# Patient Record
Sex: Female | Born: 1945 | Race: Black or African American | Hispanic: No | Marital: Married | Smoking: Never smoker
Health system: Southern US, Community
[De-identification: ages and names within clinical notes are randomized; demographics above are authoritative.]

## PROBLEM LIST (undated history)

## (undated) DIAGNOSIS — M21612 Bunion of left foot: Secondary | ICD-10-CM

## (undated) DIAGNOSIS — J449 Chronic obstructive pulmonary disease, unspecified: Secondary | ICD-10-CM

## (undated) DIAGNOSIS — E785 Hyperlipidemia, unspecified: Secondary | ICD-10-CM

## (undated) DIAGNOSIS — I872 Venous insufficiency (chronic) (peripheral): Secondary | ICD-10-CM

## (undated) DIAGNOSIS — M21611 Bunion of right foot: Secondary | ICD-10-CM

## (undated) DIAGNOSIS — I10 Essential (primary) hypertension: Secondary | ICD-10-CM

## (undated) DIAGNOSIS — M8580 Other specified disorders of bone density and structure, unspecified site: Secondary | ICD-10-CM

## (undated) DIAGNOSIS — H02403 Unspecified ptosis of bilateral eyelids: Secondary | ICD-10-CM

## (undated) DIAGNOSIS — J309 Allergic rhinitis, unspecified: Secondary | ICD-10-CM

## (undated) DIAGNOSIS — I6381 Other cerebral infarction due to occlusion or stenosis of small artery: Secondary | ICD-10-CM

## (undated) HISTORY — DX: Bunion of right foot: M21.611

## (undated) HISTORY — DX: Hyperlipidemia, unspecified: E78.5

## (undated) HISTORY — DX: Bunion of left foot: M21.612

## (undated) HISTORY — DX: Other cerebral infarction due to occlusion or stenosis of small artery: I63.81

## (undated) HISTORY — DX: Essential (primary) hypertension: I10

## (undated) HISTORY — DX: Allergic rhinitis, unspecified: J30.9

## (undated) HISTORY — DX: Venous insufficiency (chronic) (peripheral): I87.2

## (undated) HISTORY — PX: COLONOSCOPY: SHX174

## (undated) HISTORY — DX: Unspecified ptosis of bilateral eyelids: H02.403

## (undated) HISTORY — DX: Other specified disorders of bone density and structure, unspecified site: M85.80

## (undated) HISTORY — DX: Chronic obstructive pulmonary disease, unspecified: J44.9

---

## 1974-01-14 HISTORY — PX: BREAST BIOPSY: SHX20

## 1997-05-30 ENCOUNTER — Other Ambulatory Visit: Admission: RE | Admit: 1997-05-30 | Discharge: 1997-05-30 | Payer: Self-pay | Admitting: Obstetrics and Gynecology

## 1999-11-27 ENCOUNTER — Other Ambulatory Visit: Admission: RE | Admit: 1999-11-27 | Discharge: 1999-11-27 | Payer: Self-pay | Admitting: Obstetrics and Gynecology

## 2001-04-30 ENCOUNTER — Other Ambulatory Visit: Admission: RE | Admit: 2001-04-30 | Discharge: 2001-04-30 | Payer: Self-pay | Admitting: *Deleted

## 2001-08-11 ENCOUNTER — Encounter: Admission: RE | Admit: 2001-08-11 | Discharge: 2001-08-11 | Payer: Self-pay | Admitting: Family Medicine

## 2001-08-11 ENCOUNTER — Encounter: Payer: Self-pay | Admitting: Family Medicine

## 2002-01-21 ENCOUNTER — Ambulatory Visit (HOSPITAL_COMMUNITY): Admission: RE | Admit: 2002-01-21 | Discharge: 2002-01-21 | Payer: Self-pay | Admitting: Gastroenterology

## 2005-08-07 ENCOUNTER — Encounter: Admission: RE | Admit: 2005-08-07 | Discharge: 2005-08-07 | Payer: Self-pay | Admitting: Obstetrics and Gynecology

## 2006-01-14 DIAGNOSIS — I6381 Other cerebral infarction due to occlusion or stenosis of small artery: Secondary | ICD-10-CM

## 2006-01-14 HISTORY — DX: Other cerebral infarction due to occlusion or stenosis of small artery: I63.81

## 2006-12-19 ENCOUNTER — Encounter: Admission: RE | Admit: 2006-12-19 | Discharge: 2006-12-19 | Payer: Self-pay | Admitting: Family Medicine

## 2009-04-16 ENCOUNTER — Inpatient Hospital Stay (HOSPITAL_COMMUNITY): Admission: AD | Admit: 2009-04-16 | Discharge: 2009-04-16 | Payer: Self-pay | Admitting: Obstetrics & Gynecology

## 2009-08-18 ENCOUNTER — Encounter: Admission: RE | Admit: 2009-08-18 | Discharge: 2009-08-18 | Payer: Self-pay | Admitting: Family Medicine

## 2010-04-04 LAB — URINALYSIS, ROUTINE W REFLEX MICROSCOPIC
Bilirubin Urine: NEGATIVE
Glucose, UA: NEGATIVE mg/dL
Ketones, ur: NEGATIVE mg/dL
Nitrite: NEGATIVE
Protein, ur: 100 mg/dL — AB
Specific Gravity, Urine: 1.015 (ref 1.005–1.030)
Urobilinogen, UA: 0.2 mg/dL (ref 0.0–1.0)
pH: 7.5 (ref 5.0–8.0)

## 2010-04-04 LAB — URINE MICROSCOPIC-ADD ON

## 2010-06-01 NOTE — Op Note (Signed)
NAME:  Olivia Richmond, Olivia Richmond                          ACCOUNT NO.:  [id number]   MEDICAL RECORD NO.:  [id number]                   PATIENT TYPE:  AMB   LOCATION:  ENDO                                 FACILITY:  [facility]   PHYSICIAN:  Anselmo Rod, M.D.               DATE OF BIRTH:  Nov 09, 1945   DATE OF PROCEDURE:  01/21/2002  DATE OF DISCHARGE:                                 OPERATIVE REPORT   PROCEDURE PERFORMED:  Screening colonoscopy.   ENDOSCOPIST:  Anselmo Rod, M.D.   INSTRUMENT USED:  Pediatric adjustable Olympus colonoscope changed to an  adult colonoscope   INDICATIONS FOR PROCEDURE:  Screening colonoscopy being performed in a 65-  year-old African-American female, rule out colonic polyps, masses, etc.   PREPROCEDURE PREPARATION:  Informed consent was procured from the patient.  The patient was fasted for eight hours prior to the procedure and prepped  with a bottle of MiraLax and a bottle of Gatorade the night prior to the  procedure.   PREPROCEDURE PHYSICAL:  VITAL SIGNS:  The patient had stable vital signs.  NECK:  Supple.  CHEST:  Clear to auscultation.  S1 and S2 regular.  ABDOMEN:  Soft with normal bowel sounds.   DESCRIPTION OF PROCEDURE:  The patient was placed in the left lateral  decubitus position, sedated with 70 mg of  Demerol and 7 mg of Versed  intravenously.  Once the patient was adequately sedated and maintained on  low flow oxygen and continuous cardiac monitoring, the Olympus video  colonoscope was advanced into the rectum to the cecum with difficulty.  The  pediatric scope had to be withdrawn after several attempts and changing the  patient's position from the left lateral to supine and right lateral  position.  The scope could not be advanced beyond the hepatic flexure  because of redundancy.  Therefore, the pediatric scope was withdrawn and an  adult colonoscope was used instead.  The adult colonoscope was advanced up  to the cecum without  difficulty.  The appendicular orifice and the ileocecal  valve were clearly visualized and photographed.  No masses, polyps,  erosions, ulcerations, diverticula or hemorrhage were seen.   IMPRESSION:  1. Normal colonoscope up to the cecum.  2. Very tortuous colon.  No other abnormalities noted.   RECOMMENDATIONS:  1. A high fiber diet has been discussed with the patient.  2.     Repeat colorectal cancer screening is recommended in the next ten years     unless the patient develops any abnormal symptoms in the interim.  3. Outpatient follow up on a p.r.n. basis.                                               Anselmo Rod, M.D.  JNM/MEDQ  D:  01/21/2002  T:  01/21/2002  Job:  [id number]   cc:   Mosetta Putt, M.D.  [address] [place]  [place] [address]  Fax: [phone]

## 2011-07-11 ENCOUNTER — Encounter: Payer: Self-pay | Admitting: Family Medicine

## 2011-07-11 ENCOUNTER — Ambulatory Visit (INDEPENDENT_AMBULATORY_CARE_PROVIDER_SITE_OTHER): Payer: BC Managed Care – PPO | Admitting: Family Medicine

## 2011-07-11 VITALS — BP 110/74 | HR 64 | Ht 60.0 in | Wt 133.0 lb

## 2011-07-11 DIAGNOSIS — L089 Local infection of the skin and subcutaneous tissue, unspecified: Secondary | ICD-10-CM

## 2011-07-11 DIAGNOSIS — L723 Sebaceous cyst: Secondary | ICD-10-CM

## 2011-07-11 MED ORDER — CEPHALEXIN 500 MG PO CAPS: 500.0000 mg | ORAL_CAPSULE | Freq: Three times a day (TID) | ORAL | Status: AC

## 2011-07-11 NOTE — Patient Instructions (Addendum)
The cyst on your back is not infected, and likely doesn't require treatment.  If it changes in size, color, shape, then it should be removed.  If it gets infected, it will increase in size and become painful, and you should return for treatment.  Otherwise, the removal of this cyst is mostly cosmetic.  I incised and drained the infected sebaceous cyst behind your right ear. It will continue to drain some infected fluid.  Keep the area clean and dry until it heals.  Likely, the cyst may recur.  You may elect to have the cyst removed (by surgeon or dermatologist) to prevent further recurrent infection/abscess, but this is optional, and not urgent.  Please take the antibiotics three times daily for 10 days.  Return for re-check only if you have concerns (ongoing pain, drainage, fevers, or other concerns).  Epidermal Cyst An epidermal cyst is sometimes called a sebaceous cyst, epidermal inclusion cyst, or infundibular cyst. These cysts usually contain a substance that looks "pasty" or "cheesy" and may have a bad smell. This substance is a protein called keratin. Epidermal cysts are usually found on the face, neck, or trunk. They may also occur in the vaginal area or other parts of the genitalia of both men and women. Epidermal cysts are usually small, painless, slow-growing bumps or lumps that move freely under the skin. It is important not to try to pop them. This may cause an infection and lead to tenderness and swelling. CAUSES  Epidermal cysts may be caused by a deep penetrating injury to the skin or a plugged hair follicle, often associated with acne. SYMPTOMS  Epidermal cysts can become inflamed and cause:  Redness.   Tenderness.   Increased temperature of the skin over the bumps or lumps.   Grayish-white, bad smelling material that drains from the bump or lump.  DIAGNOSIS  Epidermal cysts are easily diagnosed by your caregiver during an exam. Rarely, a tissue sample (biopsy) may be taken to  rule out other conditions that may resemble epidermal cysts. TREATMENT   Epidermal cysts often get better and disappear on their own. They are rarely ever cancerous.   If a cyst becomes infected, it may become inflamed and tender. This may require opening and draining the cyst. Treatment with antibiotics may be necessary. When the infection is gone, the cyst may be removed with minor surgery.   Small, inflamed cysts can often be treated with antibiotics or by injecting steroid medicines.   Sometimes, epidermal cysts become large and bothersome. If this happens, surgical removal in your caregiver's office may be necessary.  HOME CARE INSTRUCTIONS  Only take over-the-counter or prescription medicines as directed by your caregiver.   Take your antibiotics as directed. Finish them even if you start to feel better.  SEEK MEDICAL CARE IF:   Your cyst becomes tender, red, or swollen.   Your condition is not improving or is getting worse.   You have any other questions or concerns.  MAKE SURE YOU:  Understand these instructions.   Will watch your condition.   Will get help right away if you are not doing well or get worse.  Document Released: 12/02/2003 Document Revised: 12/20/2010 Document Reviewed: 07/09/2010 [facility] Patient Information 2012 [place], [place].

## 2011-07-11 NOTE — Progress Notes (Signed)
Chief Complaint  Patient presents with  . Advice Only    new patient would like you to look at a mole on her back that has grown in size. Also behind her right ear on scalp she has a cyst that has been there x 1 month (very little pain associated).   HPI:  Saw Dr. Duaine Dredge about mass behind R ear.  He called it a "grease spot" and she would like second opinion.  Has noticed the lump in April 2013.  It has gotten a little larger recently, but denies any significant discomfort.    On her back, it used to be a blackhead, but now seems more like a mole.  Has changed in size.  Denies any pain or discomfort.  Would like to have it checked out--finds that it doesn't look very attractive, but not painful.  She is a patient of Dr. Duaine Dredge at [facility], but was seeking a second opinion regarding these lesions.  Past Medical History  Diagnosis Date  . Hypertension   . Hyperlipidemia   . Allergic rhinitis, cause unspecified     on immunotherapy monthly from allergist (pollen, dust, mold, shellfish)    Past Surgical History  Procedure Date  . Breast biopsy 1976    benign    History   Social History  . Marital Status: Married    Spouse Name: N/A    Number of Children: 2  . Years of Education: N/A   Occupational History  . reading teacher at [employer]    Social History Main Topics  . Smoking status: Never Smoker   . Smokeless tobacco: Never Used  . Alcohol Use: No  . Drug Use: No  . Sexually Active: Not on file   Other Topics Concern  . Not on file   Social History Narrative   Lives with husband, daughter, 1 dog.  Son lives in [place]    Family History  Problem Relation Age of Onset  . Kidney disease Mother   . Hypertension Mother   . Heart disease Father   . Hypertension Father   . Cancer Sister 33    breast cancer  . Hypertension Sister   . Hyperlipidemia Sister   . Hypertension Brother   . Heart disease Brother     atrial fibrillation  . Arthritis Brother     hip  replacement with ongoing pain; disabled  . Diabetes Neg Hx   . Hypertension Sister   . Hypertension Brother   . Heart disease Brother     MI  . Heart disease Brother     MI    Current outpatient prescriptions:aspirin 81 MG tablet, Take 81 mg by mouth daily., Disp: , Rfl: ;  atorvastatin (LIPITOR) 20 MG tablet, Take 20 mg by mouth daily., Disp: , Rfl: ;  chlorthalidone (HYGROTON) 25 MG tablet, Take 12.5 mg by mouth daily., Disp: , Rfl: ;  lisinopril (PRINIVIL,ZESTRIL) 20 MG tablet, Take 40 mg by mouth daily., Disp: , Rfl:  Multiple Vitamins-Minerals (MULTIVITAMIN WITH MINERALS) tablet, Take 2 tablets by mouth daily., Disp: , Rfl: ;  cephALEXin (KEFLEX) 500 MG capsule, Take 1 capsule (500 mg total) by mouth 3 (three) times daily., Disp: 30 capsule, Rfl: 0  Allergies  Allergen Reactions  . Shellfish Allergy Itching, Rash and Other (See Comments)    GI Upset.   ROS:  Denies headaches, dizziness, fevers, URI symptoms, cough, shortness of breath, skin rashes, GI complaints or other concerns.  PHYSICAL EXAM: BP 110/74  Pulse  64  Ht 5' (1.524 m)  Wt 133 lb (60.328 kg)  BMI 25.97 kg/m2 Well developed, pleasant female in no distress Skin: Upper back on R side-- Dark/raised area measures 8 x 10 mm, with central black portion measuring 5 x 7 mm.  Nontender.  No erythema, warmth, or fluctuance, nontender  Behind R ear--2 x 1 cm fluctuant area.  Nontender, with no overlying warmth, slight erythema.  Upon pressing on the lesion, there is a <55mm area of drainage just behind the ear, draining white material.  ASSESSMENT/PLAN: 1. Infected sebaceous cyst of skin  Incise and drain abcess, cephALEXin (KEFLEX) 500 MG capsule  2. Sebaceous cyst     Risks, benefits and alternatives of I&D was discussed with patient, who elected to proceed with procedure.  The area was cleansed with alcohol x 3 (due to pt being allergic to shellfish, avoided iodine), and then anesthetized with 1% lidocaine.  11 blade was  then used to make incision, and large amount of sebaceous, foul smelling and also some thin purulent material was obtained.  Patient tolerated the procedure well without complication.  Instructed on proper woundcare.  Will treat with Keflex, and advised to f/u if has any ongoing concerns.  Regarding the noninfected ECI on her back, she can return for elective excision, vs continue to monitor.  A picture was taken on her phone so that she can more easily monitor for change in shape/color.  If further changes occur, then needs to return for excision.  HTN--well controlled She plans to continue to be under the care of Dr. Duaine Dredge for her chronic medical problems.

## 2011-08-05 ENCOUNTER — Ambulatory Visit (INDEPENDENT_AMBULATORY_CARE_PROVIDER_SITE_OTHER): Payer: BC Managed Care – PPO | Admitting: Family Medicine

## 2011-08-05 ENCOUNTER — Encounter: Payer: Self-pay | Admitting: Family Medicine

## 2011-08-05 VITALS — BP 112/68 | HR 64 | Temp 97.6°F | Ht 60.0 in | Wt 134.0 lb

## 2011-08-05 DIAGNOSIS — L723 Sebaceous cyst: Secondary | ICD-10-CM

## 2011-08-05 DIAGNOSIS — I10 Essential (primary) hypertension: Secondary | ICD-10-CM

## 2011-08-05 NOTE — Progress Notes (Signed)
Chief Complaint  Patient presents with  . Follow-up    recheck cyst behind her ear.   She is s/p I&D of sebaceous cyst behind R ear on 6/27.  Completed course of keflex without problems, and is much better.  I believe this was supposed to be a one week f/u, but in error was scheduled for 1 month check. She denies any complaints today, except for some dizziness this morning.  Past Medical History  Diagnosis Date  . Hypertension   . Hyperlipidemia   . Allergic rhinitis, cause unspecified     on immunotherapy monthly from allergist (pollen, dust, mold, shellfish)    Current Outpatient Prescriptions on File Prior to Visit  Medication Sig Dispense Refill  . aspirin 81 MG tablet Take 81 mg by mouth daily.      Marland Kitchen atorvastatin (LIPITOR) 20 MG tablet Take 20 mg by mouth daily.      . chlorthalidone (HYGROTON) 25 MG tablet Take 12.5 mg by mouth daily.      Marland Kitchen lisinopril (PRINIVIL,ZESTRIL) 20 MG tablet Take 40 mg by mouth daily.      . Multiple Vitamins-Minerals (MULTIVITAMIN WITH MINERALS) tablet Take 2 tablets by mouth daily.       Allergies  Allergen Reactions  . Shellfish Allergy Itching, Rash and Other (See Comments)    GI Upset.   ROS: felt a little dizzy when she woke up this morning, and maybe a day last week.  Feels fine now. Denies any skin concerns or other problems  PHYSICAL EXAM:  BP 94/60  Pulse 64  Temp 97.6 F (36.4 C) (Oral)  Ht 5' (1.524 m)  Wt 134 lb (60.782 kg)  BMI 26.17 kg/m2 112/68 on repeat RA by MD Well developed, pleasant female in no distress Behind R ear--healed well, no drainage, erythema, or tenderness.  Very slight residual cyst, nontender. Back: EIC unchanged. There is also a very small one just below the larger one  ASSESSMENT/PLAN: 1. Sebaceous cyst   2. Essential hypertension, benign    Infected cyst--much improved/resolved.  Some residual cyst.  If it bothers her, send to derm or surgery for elective excision (near where her glasses sit).   Return here for I&D if recurrent infection.  This likely will not recur any time soon. Reinforced what we discussed at last visit regarding EIC on her back.  Mildly symptomatic low BP--encouraged to drink plenty of fluids.  If continues to have low BP and dizziness, will need meds adjusted--follow up with Dr. Duaine Dredge, as she is a patient at [facility].

## 2011-08-05 NOTE — Patient Instructions (Addendum)
Please make sure to drink plenty of fluids. If you are having ongoing dizziness, and low BP's, follow up with Dr. Duaine Dredge, as your blood pressure medication may need to be adjusted.  If you desire complete excision of the cyst behind right ear (if it starts to bother you with your glasses, etc), then I usually refer to dermatologist or general surgery for removal of lesions from the scalp.  This does not NEED to be removed, unless it bothers you.  There is still a small cyst, which potentially could get infected again--and increase in size and become tender, as before.

## 2011-08-09 ENCOUNTER — Ambulatory Visit: Payer: Self-pay | Admitting: Internal Medicine

## 2012-10-05 ENCOUNTER — Encounter: Payer: Self-pay | Admitting: Internal Medicine

## 2012-10-19 ENCOUNTER — Other Ambulatory Visit: Payer: Self-pay | Admitting: Family Medicine

## 2012-10-19 ENCOUNTER — Ambulatory Visit
Admission: RE | Admit: 2012-10-19 | Discharge: 2012-10-19 | Disposition: A | Discharge status: Home / Self Care | Payer: Medicare Other | Source: Ambulatory Visit | Attending: Family Medicine | Admitting: Family Medicine

## 2012-10-19 DIAGNOSIS — M79604 Pain in right leg: Secondary | ICD-10-CM

## 2012-11-21 ENCOUNTER — Ambulatory Visit (INDEPENDENT_AMBULATORY_CARE_PROVIDER_SITE_OTHER): Payer: BC Managed Care – PPO | Admitting: Family Medicine

## 2012-11-21 DIAGNOSIS — M25519 Pain in unspecified shoulder: Secondary | ICD-10-CM

## 2012-11-21 DIAGNOSIS — M25511 Pain in right shoulder: Secondary | ICD-10-CM
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
## 2012-11-24 ENCOUNTER — Ambulatory Visit (AMBULATORY_SURGERY_CENTER): Payer: BC Managed Care – PPO | Admitting: *Deleted

## 2012-11-24 VITALS — Ht 60.0 in | Wt 137.4 lb

## 2012-11-24 DIAGNOSIS — Z1211 Encounter for screening for malignant neoplasm of colon: Secondary | ICD-10-CM

## 2012-11-24 MED ORDER — NA SULFATE-K SULFATE-MG SULF 17.5-3.13-1.6 GM/177ML PO SOLN: 1.0000 | Freq: Once | ORAL | Status: DC

## 2012-11-24 NOTE — Progress Notes (Signed)
No allergies to eggs or soy. No problems with anesthesia.  

## 2012-12-04 ENCOUNTER — Encounter: Payer: Self-pay | Admitting: Internal Medicine

## 2012-12-04 ENCOUNTER — Ambulatory Visit (AMBULATORY_SURGERY_CENTER): Payer: Medicare Other | Admitting: Internal Medicine

## 2012-12-04 VITALS — BP 132/76 | HR 55 | Temp 97.2°F | Resp 19 | Ht 60.0 in | Wt 137.0 lb

## 2012-12-04 DIAGNOSIS — Z1211 Encounter for screening for malignant neoplasm of colon: Secondary | ICD-10-CM

## 2012-12-04 MED ORDER — SODIUM CHLORIDE 0.9 % IV SOLN: 500.0000 mL | INTRAVENOUS | Status: DC
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
## 2012-12-04 NOTE — Patient Instructions (Addendum)
The colonoscopy was normal and the prep was fine.  Next routine colonoscopy in 10 years - 2024.  It is the time of year to have a vaccination to prevent the flu (influenza virus). Please have this done through your primary care provider or you can get this done at local pharmacies or the [facility]. It would be very helpful if you notify your primary care provider when and where you had the vaccination given by messaging them in My Chart, leaving a message or faxing the information.  I appreciate the opportunity to care for you. Iva Boop, MD, [facility]   Discharge instructions given with verbal understanding. Normal exam. Resume previous medications. YOU HAD AN ENDOSCOPIC PROCEDURE TODAY AT THE [facility]: Refer to the procedure report that was given to you for any specific questions about what was found during the examination.  If the procedure report does not answer your questions, please call your gastroenterologist to clarify.  If you requested that your care partner not be given the details of your procedure findings, then the procedure report has been included in a sealed envelope for you to review at your convenience later.  YOU SHOULD EXPECT: Some feelings of bloating in the abdomen. Passage of more gas than usual.  Walking can help get rid of the air that was put into your GI tract during the procedure and reduce the bloating. If you had a lower endoscopy (such as a colonoscopy or flexible sigmoidoscopy) you may notice spotting of blood in your stool or on the toilet paper. If you underwent a bowel prep for your procedure, then you may not have a normal bowel movement for a few days.  DIET: Your first meal following the procedure should be a light meal and then it is ok to progress to your normal diet.  A half-sandwich or bowl of soup is an example of a good first meal.  Heavy or fried foods are harder to digest and may make you feel nauseous or bloated.  Likewise meals  heavy in dairy and vegetables can cause extra gas to form and this can also increase the bloating.  Drink plenty of fluids but you should avoid alcoholic beverages for 24 hours.  ACTIVITY: Your care partner should take you home directly after the procedure.  You should plan to take it easy, moving slowly for the rest of the day.  You can resume normal activity the day after the procedure however you should NOT DRIVE or use heavy machinery for 24 hours (because of the sedation medicines used during the test).    SYMPTOMS TO REPORT IMMEDIATELY: A gastroenterologist can be reached at any hour.  During normal business hours, 8:30 AM to 5:00 PM Monday through Friday, call [phone].  After hours and on weekends, please call the GI answering service at [phone] who will take a message and have the physician on call contact you.   Following lower endoscopy (colonoscopy or flexible sigmoidoscopy):  Excessive amounts of blood in the stool  Significant tenderness or worsening of abdominal pains  Swelling of the abdomen that is new, acute  Fever of 100F or higher  FOLLOW UP: If any biopsies were taken you will be contacted by phone or by letter within the next 1-3 weeks.  Call your gastroenterologist if you have not heard about the biopsies in 3 weeks.  Our staff will call the home number listed on your records the next business day following your procedure to check  on you and address any questions or concerns that you may have at that time regarding the information given to you following your procedure. This is a courtesy call and so if there is no answer at the home number and we have not heard from you through the emergency physician on call, we will assume that you have returned to your regular daily activities without incident.  SIGNATURES/CONFIDENTIALITY: You and/or your care partner have signed paperwork which will be entered into your electronic medical record.  These signatures attest to  the fact that that the information above on your After Visit Summary has been reviewed and is understood.  Full responsibility of the confidentiality of this discharge information lies with you and/or your care-partner.

## 2012-12-04 NOTE — Op Note (Signed)
[facility] [address] [place], [address]   COLONOSCOPY PROCEDURE REPORT  PATIENT: Olivia Richmond, Olivia Richmond.  MR#: [id number] BIRTHDATE: 1946/01/10 , 66  yrs. old GENDER: Female ENDOSCOPIST: Iva Boop, MD, [facility] REFERRED AO:ZHYQM Duaine Dredge, M.D. PROCEDURE DATE:  12/04/2012 PROCEDURE:   Colonoscopy, screening First Screening Colonoscopy - Avg.  risk and is 50 yrs.  old or older - No.  Prior Negative Screening - Now for repeat screening. 10 or more years since last screening  History of Adenoma - Now for follow-up colonoscopy & has been > or = to 3 yrs.  N/A  Polyps Removed Today? No.  Recommend repeat exam, <10 yrs? No. ASA CLASS:   Class II INDICATIONS:average risk screening and Last colonoscopy performed 10 years ago. MEDICATIONS: propofol (Diprivan) 200mg  IV, These medications were titrated to patient response per physician's verbal order, and MAC sedation, administered by CRNA  DESCRIPTION OF PROCEDURE:   After the risks benefits and alternatives of the procedure were thoroughly explained, informed consent was obtained.  A digital rectal exam revealed no abnormalities of the rectum.   The LB [id number]  endoscope was introduced through the anus and advanced to the cecum, which was identified by both the appendix and ileocecal valve. No adverse events experienced.   The quality of the prep was excellent using Suprep  The instrument was then slowly withdrawn as the colon was fully examined.      COLON FINDINGS: A normal appearing cecum, ileocecal valve, and appendiceal orifice were identified.  The ascending, hepatic flexure, transverse, splenic flexure, descending, sigmoid colon and rectum appeared unremarkable.  No polyps or cancers were seen. Retroflexed views revealed no abnormalities. The time to cecum=6 minutes 03 seconds.  Withdrawal time=8 minutes 0 seconds.  The scope was withdrawn and the procedure completed. COMPLICATIONS: There were no  complications.  ENDOSCOPIC IMPRESSION: Normal colonoscopy - excellent prep - second screening  RECOMMENDATIONS: Repeat colonoscopy/screening 10 years - 2024   eSigned:  Iva Boop, MD, [facility] 12/04/2012 1:53 PM   cc:

## 2012-12-07 ENCOUNTER — Telehealth: Payer: Self-pay | Admitting: *Deleted

## 2012-12-07 NOTE — Telephone Encounter (Signed)
  Follow up Call-  Call back number 12/04/2012  Post procedure Call Back phone  # [phone]  Permission to leave phone message Yes     Patient questions:  Do you have a fever, pain , or abdominal swelling? no Pain Score  0 *  Have you tolerated food without any problems? yes  Have you been able to return to your normal activities? yes  Do you have any questions about your discharge instructions: Diet   no Medications  no Follow up visit  no  Do you have questions or concerns about your Care? no  Actions: * If pain score is 4 or above: No action needed, pain <4.

## 2013-02-21 ENCOUNTER — Ambulatory Visit (INDEPENDENT_AMBULATORY_CARE_PROVIDER_SITE_OTHER): Payer: Medicare Other | Admitting: Emergency Medicine

## 2013-02-21 ENCOUNTER — Ambulatory Visit: Payer: Medicare Other

## 2013-02-21 VITALS — BP 104/62 | HR 68 | Temp 97.4°F | Resp 17 | Ht 60.75 in | Wt 134.4 lb

## 2013-02-21 DIAGNOSIS — R319 Hematuria, unspecified: Secondary | ICD-10-CM

## 2013-02-21 DIAGNOSIS — N390 Urinary tract infection, site not specified: Secondary | ICD-10-CM

## 2013-02-21 DIAGNOSIS — R300 Dysuria: Secondary | ICD-10-CM

## 2013-02-21 LAB — POCT UA - MICROSCOPIC ONLY
CASTS, UR, LPF, POC: NEGATIVE
Crystals, Ur, HPF, POC: NEGATIVE
Mucus, UA: NEGATIVE
Renal tubular cells: POSITIVE
Yeast, UA: NEGATIVE

## 2013-02-21 LAB — POCT URINALYSIS DIPSTICK
Bilirubin, UA: NEGATIVE
Glucose, UA: NEGATIVE
Ketones, UA: NEGATIVE
NITRITE UA: NEGATIVE
PH UA: 8.5
Protein, UA: 30
SPEC GRAV UA: 1.015
UROBILINOGEN UA: 0.2

## 2013-02-21 MED ORDER — CIPROFLOXACIN HCL 250 MG PO TABS: 250.0000 mg | ORAL_TABLET | Freq: Two times a day (BID) | ORAL | Status: DC

## 2013-02-21 NOTE — Patient Instructions (Signed)
Urinary Tract Infection  Urinary tract infections (UTIs) can develop anywhere along your urinary tract. Your urinary tract is your body's drainage system for removing wastes and extra water. Your urinary tract includes two kidneys, two ureters, a bladder, and a urethra. Your kidneys are a pair of bean-shaped organs. Each kidney is about the size of your fist. They are located below your ribs, one on each side of your spine.  CAUSES  Infections are caused by microbes, which are microscopic organisms, including fungi, viruses, and bacteria. These organisms are so small that they can only be seen through a microscope. Bacteria are the microbes that most commonly cause UTIs.  SYMPTOMS   Symptoms of UTIs may vary by age and gender of the patient and by the location of the infection. Symptoms in young women typically include a frequent and intense urge to urinate and a painful, burning feeling in the bladder or urethra during urination. Older women and men are more likely to be tired, shaky, and weak and have muscle aches and abdominal pain. A fever may mean the infection is in your kidneys. Other symptoms of a kidney infection include pain in your back or sides below the ribs, nausea, and vomiting.  DIAGNOSIS  To diagnose a UTI, your caregiver will ask you about your symptoms. Your caregiver also will ask to provide a urine sample. The urine sample will be tested for bacteria and white blood cells. White blood cells are made by your body to help fight infection.  TREATMENT   Typically, UTIs can be treated with medication. Because most UTIs are caused by a bacterial infection, they usually can be treated with the use of antibiotics. The choice of antibiotic and length of treatment depend on your symptoms and the type of bacteria causing your infection.  HOME CARE INSTRUCTIONS   If you were prescribed antibiotics, take them exactly as your caregiver instructs you. Finish the medication even if you feel better after you  have only taken some of the medication.   Drink enough water and fluids to keep your urine clear or pale yellow.   Avoid caffeine, tea, and carbonated beverages. They tend to irritate your bladder.   Empty your bladder often. Avoid holding urine for long periods of time.   Empty your bladder before and after sexual intercourse.   After a bowel movement, women should cleanse from front to back. Use each tissue only once.  SEEK MEDICAL CARE IF:    You have back pain.   You develop a fever.   Your symptoms do not begin to resolve within 3 days.  SEEK IMMEDIATE MEDICAL CARE IF:    You have severe back pain or lower abdominal pain.   You develop chills.   You have nausea or vomiting.   You have continued burning or discomfort with urination.  MAKE SURE YOU:    Understand these instructions.   Will watch your condition.   Will get help right away if you are not doing well or get worse.  Document Released: 10/10/2004 Document Revised: 07/02/2011 Document Reviewed: 02/08/2011  ExitCare Patient Information 2014 [employer].

## 2013-02-21 NOTE — Progress Notes (Signed)
Subjective:    Patient ID: Olivia Richmond, female    DOB: [birth year], 68 y.o.   MRN: [id number]  HPI This chart was scribed for Viviann Spare Denika Krone-MD, by Ladona Ridgel Day, Scribe. This patient was seen in room 5 and the patient's care was started at 10:45 AM.  HPI Comments: Olivia Richmond is a 68 y.o. female who presents to the [facility] complaining of hematuria and dysuria, onset yesterday. She reports that it feels like she has a UTI.   She reports that since yesterday, she has been having pressure while voiding. She got up twice last PM to void.   Also had a kidney stone about one month ago. Had abdominal X-ray at this time which showed a kidney stone.  She also reports about one month ago that she was having fever and chills. She saw a provider at this time who rx w/a cough syrup. She reports that these sx have since resolved.   Patient Active Problem List   Diagnosis Date Noted  . Essential hypertension, benign 08/05/2011    Past Surgical History  Procedure Laterality Date  . Breast biopsy Left 1976    benign  . Colonoscopy     Family History  Problem Relation Age of Onset  . Kidney disease Mother   . Hypertension Mother   . Heart disease Father   . Hypertension Father   . Cancer Sister 72    breast cancer  . Hypertension Sister   . Hyperlipidemia Sister   . Hypertension Brother   . Heart disease Brother     atrial fibrillation  . Arthritis Brother     hip replacement with ongoing pain; disabled  . Diabetes Neg Hx   . Colon cancer Neg Hx   . Hypertension Sister   . Hypertension Brother   . Heart disease Brother     MI  . Heart disease Brother     MI   History   Social History  . Marital Status: Married    Spouse Name: N/A    Number of Children: 2  . Years of Education: N/A   Occupational History  . reading teacher at [employer]    Social History Main Topics  . Smoking status: Never Smoker   . Smokeless tobacco: Never Used  .  Alcohol Use: No  . Drug Use: No  . Sexual Activity: Not on file   Other Topics Concern  . Not on file   Social History Narrative   Lives with husband, daughter, 1 dog.  Son lives in [place]   Allergies  Allergen Reactions  . Shellfish Allergy Itching, Rash and Other (See Comments)    GI Upset.   Results for orders placed in visit on 02/21/13  POCT URINALYSIS DIPSTICK      Result Value Range   Color, UA yellow     Clarity, UA cloudy     Glucose, UA neg     Bilirubin, UA neg     Ketones, UA neg     Spec Grav, UA 1.015     Blood, UA large     pH, UA 8.5     Protein, UA 30     Urobilinogen, UA 0.2     Nitrite, UA neg     Leukocytes, UA moderate (2+)    POCT UA - MICROSCOPIC ONLY      Result Value Range   WBC, Ur, HPF, POC tntc     RBC, urine, microscopic  tntc     Bacteria, U Microscopic 2+     Mucus, UA neg     Epithelial cells, urine per micros 0-2     Crystals, Ur, HPF, POC neg     Casts, Ur, LPF, POC neg     Yeast, UA neg     Renal tubular cells pos     Review of Systems  Constitutional: Negative for fever and chills.  Respiratory: Negative for cough and shortness of breath.   Cardiovascular: Negative for chest pain.  Gastrointestinal: Negative for abdominal pain.  Genitourinary: Positive for dysuria and hematuria.  Musculoskeletal: Negative for back pain.      Objective:   Physical Exam Nursing note and vitals reviewed. Constitutional: Patient is oriented to person, place, and time. Patient appears well-developed and well-nourished. No distress.  HENT:  Head: Normocephalic and atraumatic.  Neck: Neck supple. No tracheal deviation present.  Cardiovascular: Normal rate, regular rhythm and normal heart sounds.   No murmur heard. Pulmonary/Chest: Effort normal and breath sounds normal. No respiratory distress. Patient has no wheezes. Patient has no rales.  Musculoskeletal: Normal range of motion.  Neurological: Patient is alert and oriented to person, place, and  time.  Skin: Skin is warm and dry.  Psychiatric: Patient has a normal mood and affect. Patient's behavior is normal.  [facility] reading (PRIMARY) by  Dr. Cleta Albertsaub no acute changes are seen. There is what appears to be a slipped or calcium deposit deep in the right side of the pelvis. This measures about 4 mm x 1 mm. . Results for orders placed in visit on 02/21/13  POCT URINALYSIS DIPSTICK      Result Value Range   Color, UA yellow     Clarity, UA cloudy     Glucose, UA neg     Bilirubin, UA neg     Ketones, UA neg     Spec Grav, UA 1.015     Blood, UA large     pH, UA 8.5     Protein, UA 30     Urobilinogen, UA 0.2     Nitrite, UA neg     Leukocytes, UA moderate (2+)    POCT UA - MICROSCOPIC ONLY      Result Value Range   WBC, Ur, HPF, POC tntc     RBC, urine, microscopic tntc     Bacteria, U Microscopic 2+     Mucus, UA neg     Epithelial cells, urine per micros 0-2     Crystals, Ur, HPF, POC neg     Casts, Ur, LPF, POC neg     Yeast, UA neg     Renal tubular cells pos         Assessment & Plan:  Urine culture was done we'll force fluids we'll treat with Cipro twice a day for 5 days.   I personally performed the services described in this documentation, which was scribed in my presence. The recorded information has been reviewed and is accurate.

## 2013-02-23 LAB — URINE CULTURE: Colony Count: >=100000

## 2013-09-27 ENCOUNTER — Other Ambulatory Visit: Payer: Self-pay | Admitting: Family Medicine

## 2013-09-27 ENCOUNTER — Ambulatory Visit
Admission: RE | Admit: 2013-09-27 | Discharge: 2013-09-27 | Disposition: A | Discharge status: Home / Self Care | Payer: 59 | Source: Ambulatory Visit | Attending: Family Medicine | Admitting: Family Medicine

## 2013-09-27 DIAGNOSIS — M546 Pain in thoracic spine: Secondary | ICD-10-CM

## 2013-10-14 ENCOUNTER — Other Ambulatory Visit (HOSPITAL_COMMUNITY): Payer: 59

## 2013-10-29 ENCOUNTER — Other Ambulatory Visit (HOSPITAL_COMMUNITY): Payer: 59

## 2013-11-26 ENCOUNTER — Ambulatory Visit (HOSPITAL_COMMUNITY)
Admission: RE | Admit: 2013-11-26 | Discharge: 2013-11-26 | Disposition: A | Discharge status: Home / Self Care | Payer: Medicare Other | Source: Ambulatory Visit | Attending: Vascular Surgery | Admitting: Vascular Surgery

## 2013-11-26 ENCOUNTER — Other Ambulatory Visit (HOSPITAL_COMMUNITY): Payer: Self-pay | Admitting: Family Medicine

## 2013-11-26 DIAGNOSIS — R0989 Other specified symptoms and signs involving the circulatory and respiratory systems: Secondary | ICD-10-CM

## 2013-11-26 DIAGNOSIS — Z8673 Personal history of transient ischemic attack (TIA), and cerebral infarction without residual deficits: Secondary | ICD-10-CM | POA: Diagnosis not present

## 2015-04-24 ENCOUNTER — Ambulatory Visit (INDEPENDENT_AMBULATORY_CARE_PROVIDER_SITE_OTHER): Payer: Medicare Other | Admitting: Pediatrics

## 2015-04-24 ENCOUNTER — Encounter: Payer: Self-pay | Admitting: Pediatrics

## 2015-04-24 VITALS — BP 112/68 | HR 68 | Temp 97.6°F | Resp 16

## 2015-04-24 DIAGNOSIS — J301 Allergic rhinitis due to pollen: Secondary | ICD-10-CM | POA: Insufficient documentation

## 2015-04-24 DIAGNOSIS — I10 Essential (primary) hypertension: Secondary | ICD-10-CM | POA: Insufficient documentation

## 2015-04-24 DIAGNOSIS — H1010 Acute atopic conjunctivitis, unspecified eye: Secondary | ICD-10-CM

## 2015-04-24 DIAGNOSIS — H1045 Other chronic allergic conjunctivitis: Secondary | ICD-10-CM | POA: Diagnosis not present

## 2015-04-24 MED ORDER — FLUTICASONE PROPIONATE 50 MCG/ACT NA SUSP: NASAL | Status: DC

## 2015-04-24 NOTE — Addendum Note (Signed)
Addended by: Illene BolusFIELDS, Joory Gough M on: 04/24/2015 11:15 AM   Modules accepted: Orders

## 2015-04-24 NOTE — Addendum Note (Signed)
Addended by: Stephannie LiBARDELAS, JOSE A on: 04/24/2015 11:29 AM   Modules accepted: Orders

## 2015-04-24 NOTE — Progress Notes (Signed)
  [address] [place] [address] Dept: [phone]  FOLLOW UP NOTE  Patient ID: Olivia Richmond, female    DOB: 02/19/1945  Age: 70 y.o. MRN: [id number] Date of Office Visit: 04/24/2015  Assessment Chief Complaint: Burning Eyes  HPI Olivia Richmond presents for evaluation of nasfal congestion and  itchy watery eyes since the end of March.. She used to be on allergy injections to grass pollens and tree pollens. She stopped her allergy injections last year.  Current medications are fexofenadine 180 mg once a day and Zaditor 0.025% one drop 3 times a day Her other medications are outlined in the chart.   Drug Allergies:  Allergies  Allergen Reactions  . Shellfish Allergy Itching, Rash and Other (See Comments)    GI Upset.    Physical Exam: BP 112/68 mmHg  Pulse 68  Temp(Src) 97.6 F (36.4 C) (Oral)  Resp 16   Physical Exam  Constitutional: She is oriented to person, place, and time. She appears well-developed and well-nourished.  HENT:  Eyes-ptosis bilaterally and palpebral conjunctival erythema  Neck: Neck supple.  Cardiovascular:  s1 and S2 normal no murmurs  Pulmonary/Chest:  Clear to percussion and auscultatio  Lymphadenopathy:    She has no cervical adenopathy.  Neurological: She is alert and oriented to person, place, and time.  Skin:  clear  Psychiatric: She has a normal mood and affect. Her behavior is normal. Judgment and thought content normal.  Vitals reviewed.   Diagnostics:  none  Assessment and Plan: 1. Allergic rhinitis due to pollen   2. Seasonal allergic conjunctivitis   3. Essential hypertension         Patient Instructions  Allegra 180 mg-one tablet once a day for runny nose or itchy eyes Fluticasone 2 sprays per nostril once a day for stuffy nose Zaditor 0.025%-one drop in each eye 3 times a day to prevent allergies Opcon-A-one drop 3 times a day if needed for itchy eyes Add prednisone 10 mg twice a day for 4 days 10 mg on  the fifth day to bring your allergies under control Call me if you're not doing well on this treatment plan    Return if symptoms worsen or fail to improve.    Thank you for the opportunity to care for this patient.  Please do not hesitate to contact me with questions.  Tonette BihariJ. A. Micaiah Litle, M.D.  [facility] [address] [place], [place] [address] [phone]

## 2015-04-24 NOTE — Patient Instructions (Signed)
Allegra 180 mg-one tablet once a day for runny nose or itchy eyes Fluticasone 2 sprays per nostril once a day for stuffy nose Zaditor 0.025%-one drop in each eye 3 times a day to prevent allergies Opcon-A-one drop 3 times a day if needed for itchy eyes Add prednisone 10 mg twice a day for 4 days 10 mg on the fifth day to bring your allergies under control Call me if you're not doing well on this treatment plan

## 2015-05-31 ENCOUNTER — Other Ambulatory Visit: Payer: Self-pay | Admitting: *Deleted

## 2015-05-31 MED ORDER — FLUTICASONE PROPIONATE 50 MCG/ACT NA SUSP: NASAL | Status: AC

## 2016-02-28 IMAGING — CR DG THORACIC SPINE 3V
3 series · 3 of 3 positions shown · non-contrast
Comparison: 08/18/2009 chest x-ray.

CLINICAL DATA: Back pain.

EXAM:
THORACIC SPINE - 2 VIEW + SWIMMERS

[view not recorded (1 of 3)]
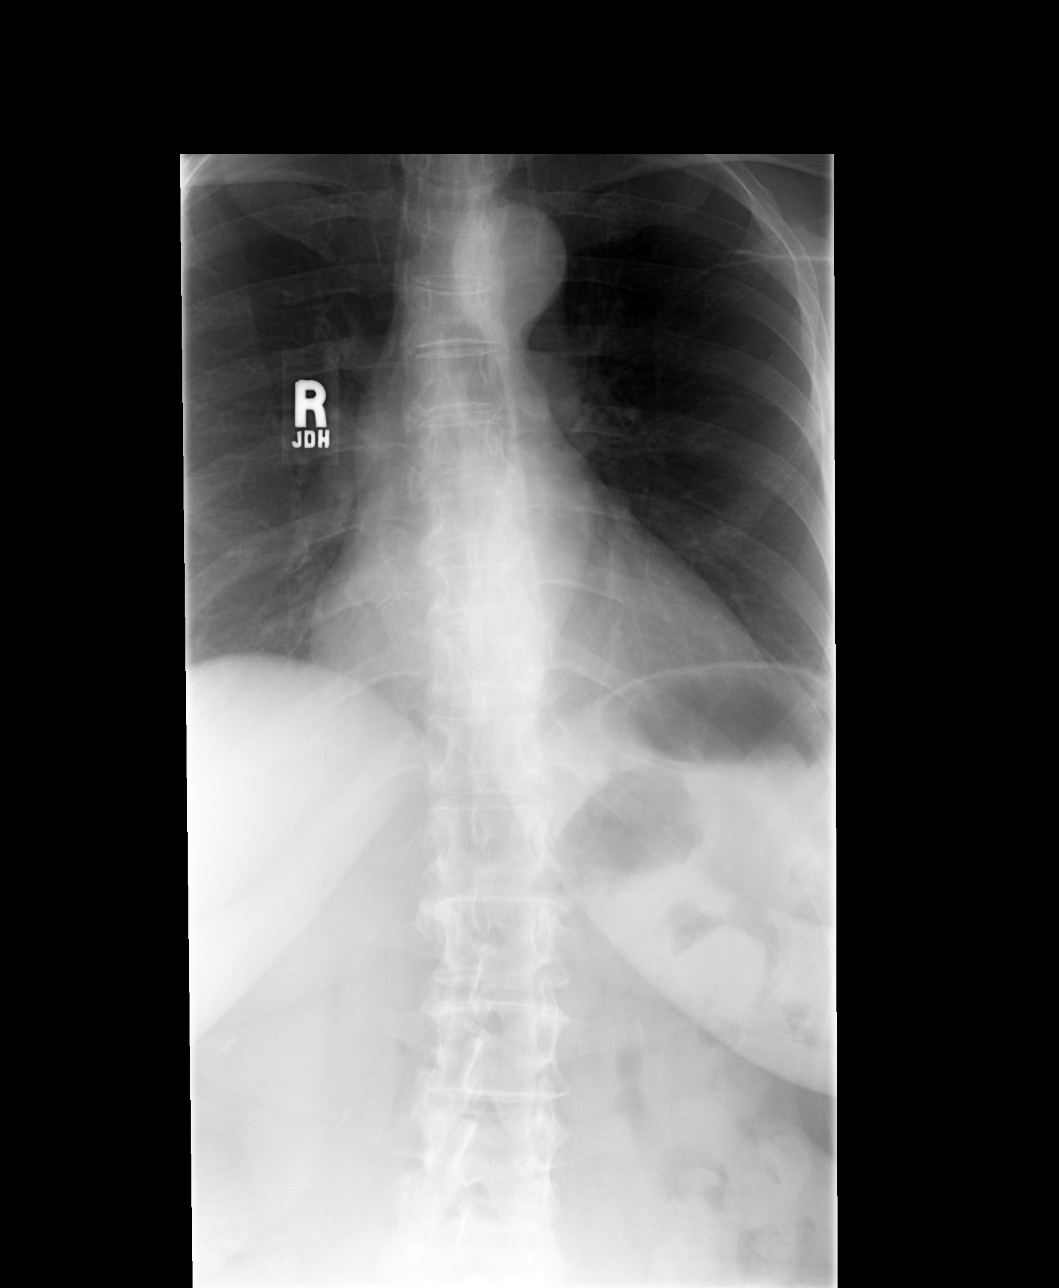

[view not recorded (2 of 3)]
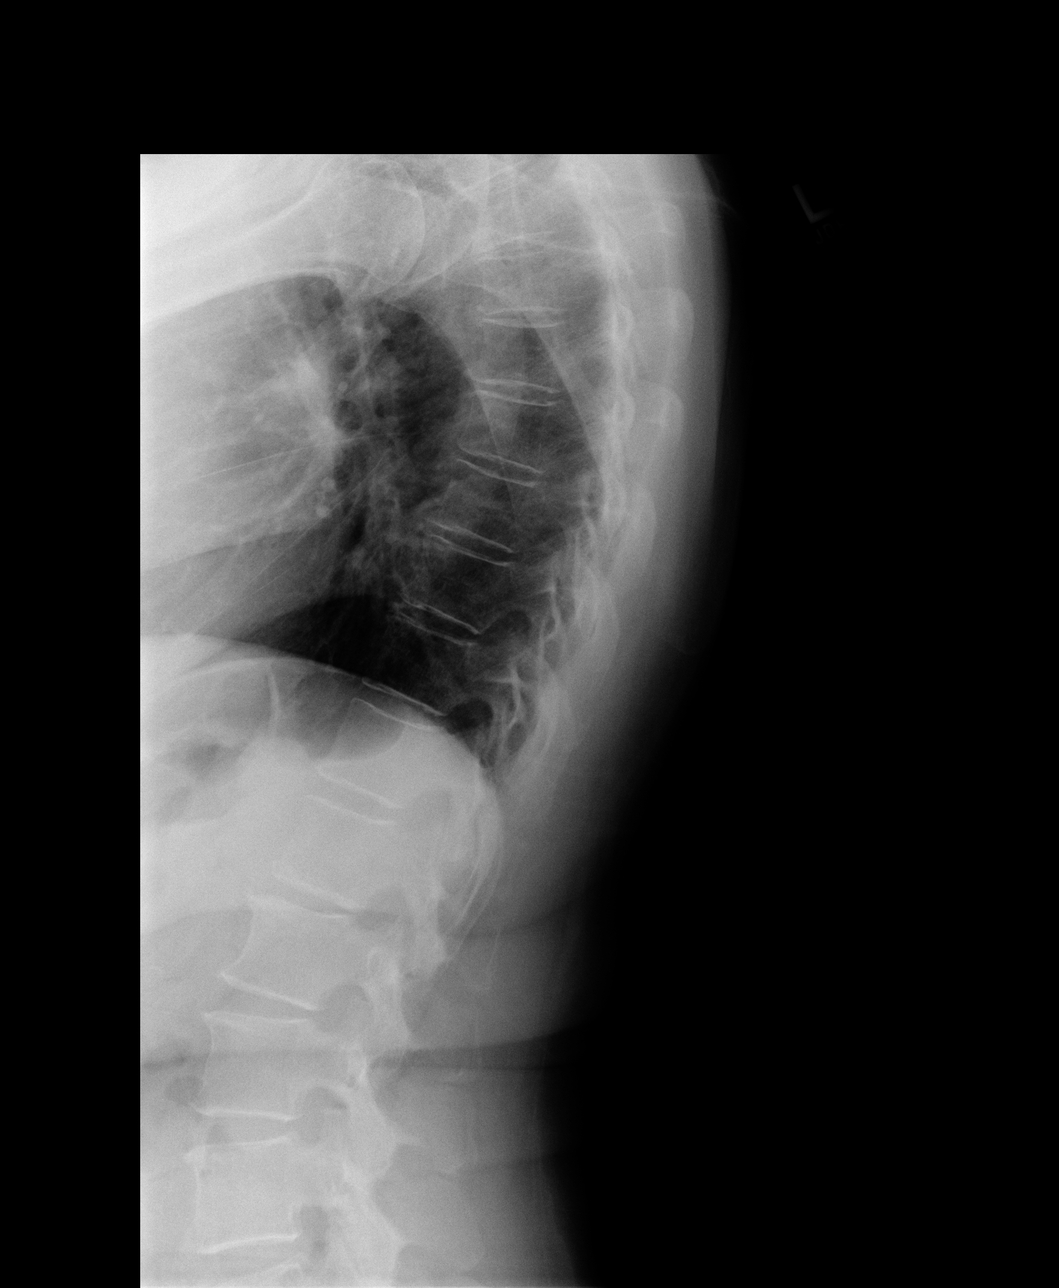

[view not recorded (3 of 3)]
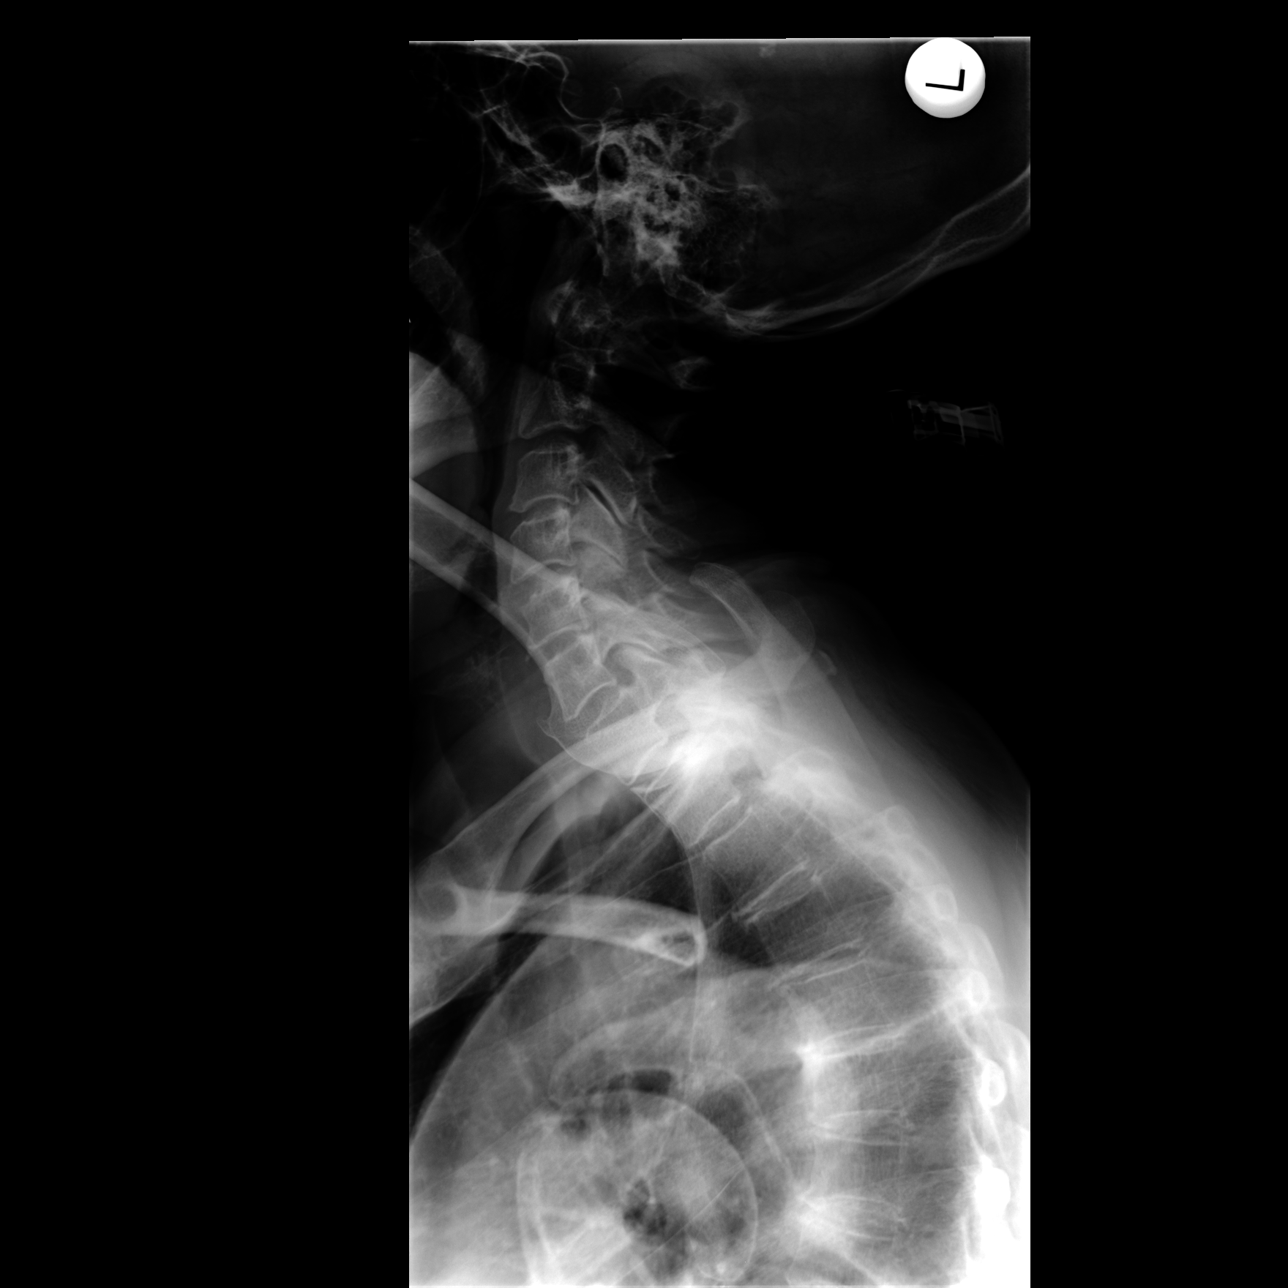

[3 of 3 positions shown; findings below may reference images not displayed]

FINDINGS: Normal alignment of the thoracic vertebral bodies. Disc spaces and
vertebral bodies are maintained. No acute compression fracture. No
abnormal paraspinal soft tissue thickening. The visualized posterior
ribs are intact. Mild stable thoracolumbar scoliosis.
IMPRESSION: Normal alignment and no acute bony findings or significant
degenerative changes.

## 2018-12-22 ENCOUNTER — Telehealth (HOSPITAL_COMMUNITY): Payer: Self-pay | Admitting: *Deleted

## 2018-12-22 NOTE — Telephone Encounter (Signed)
The above patient or their representative was contacted and gave the following answers to these questions:         Do you have any of the following symptoms?    NO  Fever                    Cough                   Shortness of breath  Do  you have any of the following other symptoms?    muscle pain         vomiting,        diarrhea        rash         weakness        red eye        abdominal pain         bruising          bruising or bleeding              joint pain           severe headache    Have you been in contact with someone who was or has been sick in the past 2 weeks?  NO  Yes                 Unsure                         Unable to assess   Does the person that you were in contact with have any of the following symptoms?   Cough         shortness of breath           muscle pain         vomiting,            diarrhea            rash            weakness           fever            red eye           abdominal pain           bruising  or  bleeding                joint pain                severe headache                 COMMENTS OR ACTION PLAN FOR THIS PATIENT:   12/22/2018 No to all per Olivia Richmond at Dr. Deboraha Sprang office.

## 2018-12-23 ENCOUNTER — Other Ambulatory Visit: Payer: Self-pay

## 2018-12-23 ENCOUNTER — Ambulatory Visit (HOSPITAL_COMMUNITY)
Admission: RE | Admit: 2018-12-23 | Discharge: 2018-12-23 | Disposition: A | Discharge status: Home / Self Care | Payer: Medicare Other | Source: Ambulatory Visit | Attending: Family Medicine | Admitting: Family Medicine

## 2018-12-23 ENCOUNTER — Encounter (HOSPITAL_COMMUNITY): Payer: Self-pay

## 2018-12-23 ENCOUNTER — Other Ambulatory Visit (HOSPITAL_COMMUNITY): Payer: Self-pay | Admitting: Family Medicine

## 2018-12-23 DIAGNOSIS — I639 Cerebral infarction, unspecified: Secondary | ICD-10-CM

## 2019-08-06 ENCOUNTER — Encounter (INDEPENDENT_AMBULATORY_CARE_PROVIDER_SITE_OTHER): Payer: Self-pay | Admitting: Otolaryngology

## 2019-08-06 ENCOUNTER — Ambulatory Visit (INDEPENDENT_AMBULATORY_CARE_PROVIDER_SITE_OTHER): Payer: Medicare PPO | Admitting: Otolaryngology

## 2019-08-06 ENCOUNTER — Other Ambulatory Visit: Payer: Self-pay

## 2019-08-06 VITALS — Temp 97.0°F

## 2019-08-06 DIAGNOSIS — H6121 Impacted cerumen, right ear: Secondary | ICD-10-CM | POA: Diagnosis not present

## 2019-08-06 DIAGNOSIS — H903 Sensorineural hearing loss, bilateral: Secondary | ICD-10-CM

## 2019-08-06 NOTE — Progress Notes (Signed)
HPI: Olivia Richmond is a 74 y.o. female who presents for evaluation of gradual decline in her hearing in both ears.  She has noticed that her hearing is gradually gotten worse to the point where she wants to consider hearing aids.  Denies any ear pain or drainage.  Past Medical History:  Diagnosis Date  . Allergic rhinitis, cause unspecified    on immunotherapy monthly from allergist (pollen, dust, mold, shellfish)  . Bilateral bunions   . COPD (chronic obstructive pulmonary disease) (HCC)    xray/spirometry   . Hyperlipidemia   . Hypertension   . Left sided lacunar stroke (HCC) 2008   thalamus, r facial wkness  . Osteopenia   . Ptosis, both eyelids   . Venous insufficiency    Past Surgical History:  Procedure Laterality Date  . BREAST BIOPSY Left 1976   benign  . COLONOSCOPY     Social History   Socioeconomic History  . Marital status: Married    Spouse name: Not on file  . Number of children: 2  . Years of education: Not on file  . Highest education level: Not on file  Occupational History  . 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 Smoking status: Never Smoker  . Smokeless tobacco: Never Used  Substance and Sexual Activity  . Alcohol use: No  . Drug use: No  . Sexual activity: Not on file  Other Topics Concern  . Not on file  Social History Narrative   Lives with husband, daughter, 1 dog.  Son lives in [place]   Social Determinants of Health   Financial Resource Strain:   . Difficulty of Paying Living Expenses:   Food Insecurity:   . Worried About Programme researcher, broadcasting/film/video in the Last Year:   . Barista in the Last Year:   Transportation Needs:   . Freight forwarder (Medical):   Marland Kitchen Lack of Transportation (Non-Medical):   Physical Activity:   . Days of Exercise per Week:   . Minutes of Exercise per Session:   Stress:   . Feeling of Stress :   Social Connections:   . Frequency of Communication with Friends and Family:   .  Frequency of Social Gatherings with Friends and Family:   . Attends Religious Services:   . Active Member of Clubs or Organizations:   . Attends Banker Meetings:   Marland Kitchen Marital Status:    Family History  Problem Relation Age of Onset  . Kidney disease Mother   . Hypertension Mother   . Heart disease Father   . Hypertension Father   . Cancer Sister 35       breast cancer  . Hypertension Sister   . Hyperlipidemia Sister   . Hypertension Brother   . Heart disease Brother        atrial fibrillation  . Arthritis Brother        hip replacement with ongoing pain; disabled  . Diabetes Neg Hx   . Colon cancer Neg Hx   . Hypertension Sister   . Hypertension Brother   . Heart disease Brother        MI  . Heart disease Brother        MI   Allergies  Allergen Reactions  . Shellfish Allergy Itching, Rash and Other (See Comments)    GI Upset.   Prior to Admission medications   Medication Sig Start Date End Date Taking? Authorizing Provider  aspirin 81 MG tablet Take 81 mg by mouth daily.   Yes [provider]  atorvastatin (LIPITOR) 20 MG tablet Take 20 mg by mouth daily.   Yes [provider]  chlorthalidone (HYGROTON) 25 MG tablet Take 12.5 mg by mouth daily.   Yes [provider]  cholecalciferol (VITAMIN D) 1000 UNITS tablet Take 1,000 Units by mouth daily.   Yes [provider]  ciprofloxacin (CIPRO) 250 MG tablet Take 1 tablet (250 mg total) by mouth 2 (two) times daily. 02/21/13  Yes Collene Gobble, MD  CRANBERRY PO Take 2 capsules by mouth daily. Reported on 04/24/2015   Yes [provider]  fexofenadine (ALLEGRA) 180 MG tablet Take 180 mg by mouth daily.   Yes [provider]  fluticasone (FLONASE) 50 MCG/ACT nasal spray Two Sprays into each nostril once daily for stuffy nose. 05/31/15  Yes Bardelas, Bonnita Hollow, MD  ketotifen (ZADITOR) 0.025 % ophthalmic solution 1 drop as needed.   Yes [provider]  losartan  (COZAAR) 50 MG tablet Take 50 mg by mouth daily.   Yes [provider]  Multiple Vitamins-Minerals (MULTIVITAMIN WITH MINERALS) tablet Take 2 tablets by mouth daily.   Yes [provider]  UNABLE TO FIND Med Name: allergy shots once a month   Yes [provider]     Positive ROS: Otherwise negative  All other systems have been reviewed and were otherwise negative with the exception of those mentioned in the HPI and as above.  Physical Exam: Constitutional: Alert, well-appearing, no acute distress Ears: External ears without lesions or tenderness.  She had moderate wax buildup in the right ear canal that was cleaned with suction and forceps.  TMs were otherwise clear.  On tuning fork testing she has moderate hearing loss in both ears with the 1024 tuning fork. Nasal: External nose without lesions.. Clear nasal passages Oral: Lips and gums without lesions. Tongue and palate mucosa without lesions. Posterior oropharynx clear. Neck: No palpable adenopathy or masses Respiratory: Breathing comfortably  Skin: No facial/neck lesions or rash noted.  Cerumen impaction removal  Date/Time: 08/06/2019 1:46 PM Performed by: Drema Halon, MD Authorized by: Drema Halon, MD   Consent:    Consent obtained:  Verbal   Consent given by:  Patient   Risks discussed:  Pain and bleeding Procedure details:    Location:  R ear   Procedure type: suction and forceps   Post-procedure details:    Inspection:  TM intact and canal normal   Hearing quality:  Improved   Patient tolerance of procedure:  Tolerated well, no immediate complications Comments:     TMs are clear bilaterally.  Audiogram demonstrated a mild hearing loss in both ears which was symmetric with SRT's of 35 dB bilaterally.  She had type a tympanograms bilaterally.  Assessment: Moderate bilateral sensorineural hearing loss. Wax buildup on the right side.  Plan: She would be a candidate for  hearing aids and discussed this with her.  Narda Bonds, MD

## 2019-08-10 ENCOUNTER — Encounter (INDEPENDENT_AMBULATORY_CARE_PROVIDER_SITE_OTHER): Payer: Self-pay

## 2019-08-23 ENCOUNTER — Ambulatory Visit (INDEPENDENT_AMBULATORY_CARE_PROVIDER_SITE_OTHER): Payer: Medicare PPO

## 2019-08-23 ENCOUNTER — Ambulatory Visit (INDEPENDENT_AMBULATORY_CARE_PROVIDER_SITE_OTHER): Payer: Medicare PPO | Admitting: Orthopaedic Surgery

## 2019-08-23 ENCOUNTER — Other Ambulatory Visit: Payer: Self-pay

## 2019-08-23 VITALS — Ht 61.0 in | Wt 128.0 lb

## 2019-08-23 DIAGNOSIS — G8929 Other chronic pain: Secondary | ICD-10-CM | POA: Diagnosis not present

## 2019-08-23 DIAGNOSIS — M25561 Pain in right knee: Secondary | ICD-10-CM

## 2019-08-23 MED ORDER — MELOXICAM 15 MG PO TABS: 15.0000 mg | ORAL_TABLET | Freq: Every day | ORAL | 3 refills | Status: AC

## 2019-08-23 MED ORDER — METHYLPREDNISOLONE 4 MG PO TABS: ORAL_TABLET | ORAL | 0 refills | Status: DC

## 2019-08-23 NOTE — Progress Notes (Signed)
Office Visit Note   Patient: Olivia Richmond           Date of Birth: 06/09/1945           MRN: [id number] Visit Date: 08/23/2019              Requested by: Mosetta Putt, MD [address] [place],  [place] [address] PCP: Mosetta Putt, MD   Assessment & Plan: Visit Diagnoses:  1. Chronic pain of right knee     Plan: I did show her her x-rays and explained in detail that she does have some arthritis in the knee but is only mild.  I did recommend a steroid injection and quad strengthening exercises.  She would like to try medical treatment for so I recommended combination of Voltaren gel combined with a steroid taper and meloxicam.  I explained quad strengthening exercises as well.  All question concerns were answered and addressed.  We can see her back in a month to consider alternative treatment such as a steroid injection if this does not help.  Follow-Up Instructions: Return in about 4 weeks (around 09/20/2019).   Orders:  Orders Placed This Encounter  Procedures  . XR Knee 1-2 Views Right   Meds ordered this encounter  Medications  . methylPREDNISolone (MEDROL) 4 MG tablet    Sig: Medrol dose pack. Take as instructed    Dispense:  21 tablet    Refill:  0  . meloxicam (MOBIC) 15 MG tablet    Sig: Take 1 tablet (15 mg total) by mouth daily.    Dispense:  30 tablet    Refill:  3      Procedures: No procedures performed   Clinical Data: No additional findings.   Subjective: Chief Complaint  Patient presents with  . Right Knee - Pain  The patient is a very pleasant 74 year old female comes in for evaluation treatment of right knee pain.  She says her right knee has been hurting for years but has been getting worse recently.  She says if she has been sitting for a while and gets up to walk it is painful and she can walk it off.  He gets stiff.  She says is worse at the end of the day as well.  She never had surgery on that knee.  She is a thin individual and  not a diabetic.  She is never injured the knee as well.  She says her left knee does not bother her.  She said no other acute change in medical status.  She is not a diabetic and does not smoke.  She is a thin individual as well.  She denies any locking or catching with the right knee.  HPI  Review of Systems She currently denies any headache, chest pain, shortness of breath, fever, chills, nausea, vomiting  Objective: Vital Signs: Ht 5\' 1"  (1.549 m)   Wt 128 lb (58.1 kg)   BMI 24.19 kg/m   Physical Exam She is alert and orient x3 and in no acute distress Ortho Exam examination of her right knee shows no effusion.  There is varus malalignment that is mild.  Her range of motion is full.  There are some global tenderness but no instability exam.  There is some slight patellofemoral crepitation. Specialty Comments:  No specialty comments available.  Imaging: XR Knee 1-2 Views Right  Result Date: 08/23/2019 2 views of the right knee showed no acute findings.  There is some mild patellofemoral arthritic  changes.  The medial lateral compartments are well-maintained.    PMFS History: Patient Active Problem List   Diagnosis Date Noted  . Allergic rhinitis due to pollen 04/24/2015  . Seasonal allergic conjunctivitis 04/24/2015  . Essential hypertension 04/24/2015  . Essential hypertension, benign 08/05/2011   Past Medical History:  Diagnosis Date  . Allergic rhinitis, cause unspecified    on immunotherapy monthly from allergist (pollen, dust, mold, shellfish)  . Bilateral bunions   . COPD (chronic obstructive pulmonary disease) (HCC)    xray/spirometry   . Hyperlipidemia   . Hypertension   . Left sided lacunar stroke (HCC) 2008   thalamus, r facial wkness  . Osteopenia   . Ptosis, both eyelids   . Venous insufficiency     Family History  Problem Relation Age of Onset  . Kidney disease Mother   . Hypertension Mother   . Heart disease Father   . Hypertension Father   .  Cancer Sister 70       breast cancer  . Hypertension Sister   . Hyperlipidemia Sister   . Hypertension Brother   . Heart disease Brother        atrial fibrillation  . Arthritis Brother        hip replacement with ongoing pain; disabled  . Diabetes Neg Hx   . Colon cancer Neg Hx   . Hypertension Sister   . Hypertension Brother   . Heart disease Brother        MI  . Heart disease Brother        MI    Past Surgical History:  Procedure Laterality Date  . BREAST BIOPSY Left 1976   benign  . COLONOSCOPY     Social History   Occupational History  . 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 Smoking status: Never Smoker  . Smokeless tobacco: Never Used  Substance and Sexual Activity  . Alcohol use: No  . Drug use: No  . Sexual activity: Not on file

## 2019-09-21 ENCOUNTER — Encounter: Payer: Self-pay | Admitting: Orthopaedic Surgery

## 2019-09-21 ENCOUNTER — Ambulatory Visit: Payer: Medicare PPO | Admitting: Orthopaedic Surgery

## 2019-09-21 DIAGNOSIS — G8929 Other chronic pain: Secondary | ICD-10-CM

## 2019-09-21 DIAGNOSIS — M25561 Pain in right knee: Secondary | ICD-10-CM | POA: Diagnosis not present

## 2019-09-21 NOTE — Progress Notes (Signed)
The patient is continue to experience right knee pain.  She is 74 years old and I did see her 4 weeks ago.  At that point x-ray showed just some mild arthritic changes in the knee.  I have recommended topical Voltaren gel as well as oral meloxicam.  We did talk about a steroid injection but she still wants to try other conservative treatment measures.  Her pain is worse with standing on the right knee for long period time.  She said her pain really depends on the amount of activities that she does.  She denies any knee swelling.  She also denies any knee locking catching.  Examination of her right knee today shows no effusion.  There is slight varus malalignment with good range of motion overall.  The knee is ligamentously stable.  She is interested in at least trying outpatient physical therapy for her right knee to see if that will help improve the strength of the knee and decrease her pain.  I still offered a steroid injection but she wants to continue to try conservative treatment which is certainly reasonable.  We will work on setting up outpatient physical therapy and then see her back in follow-up.  All questions and concerns were answered and addressed.

## 2019-09-22 ENCOUNTER — Other Ambulatory Visit: Payer: Self-pay

## 2019-09-22 DIAGNOSIS — G8929 Other chronic pain: Secondary | ICD-10-CM

## 2019-10-06 ENCOUNTER — Ambulatory Visit: Payer: Medicare PPO | Admitting: Rehabilitative and Restorative Service Providers"

## 2019-10-15 ENCOUNTER — Ambulatory Visit (INDEPENDENT_AMBULATORY_CARE_PROVIDER_SITE_OTHER): Payer: Medicare PPO | Admitting: Physical Therapy

## 2019-10-15 ENCOUNTER — Other Ambulatory Visit: Payer: Self-pay

## 2019-10-15 DIAGNOSIS — R262 Difficulty in walking, not elsewhere classified: Secondary | ICD-10-CM | POA: Diagnosis not present

## 2019-10-15 DIAGNOSIS — M6281 Muscle weakness (generalized): Secondary | ICD-10-CM

## 2019-10-15 DIAGNOSIS — G8929 Other chronic pain: Secondary | ICD-10-CM | POA: Diagnosis not present

## 2019-10-15 DIAGNOSIS — M25561 Pain in right knee: Secondary | ICD-10-CM | POA: Diagnosis not present

## 2019-10-15 NOTE — Patient Instructions (Signed)
Access Code: [email or web address] URL: [email or web address] Date: 10/15/2019 Prepared by: Narda Amber  Exercises Supine Active Straight Leg Raise - 3 x daily - 7 x weekly - 2 sets - 10 reps Clamshell - 3 x daily - 7 x weekly - 2 sets - 10 reps Standing Hip Extension with Counter Support - 3 x daily - 7 x weekly - 2 sets - 10 reps Standing March with Counter Support - 3 x daily - 7 x weekly - 2 sets - 10 reps Standing Hip Abduction with Counter Support - 3 x daily - 7 x weekly - 2 sets - 10 reps

## 2019-10-15 NOTE — Therapy (Signed)
[facility] Physical Therapy [address] [place], [place], [address] Phone: [phone]   Fax:  [phone]  Physical Therapy Evaluation  Patient Details  Name: Olivia Richmond MRN: [id number] Date of Birth: 04/30/1945 Referring Provider (PT): Doneen Poisson, MD   Encounter Date: 10/15/2019   PT End of Session - 10/15/19 1126    Visit Number 1    Number of Visits 12    Date for PT Re-Evaluation 11/26/19    Authorization Type humana    Authorization - Visit Number 1    Authorization - Number of Visits 12    PT Start Time 1015    PT Stop Time 1100    PT Time Calculation (min) 45 min    Activity Tolerance Patient tolerated treatment well    Behavior During Therapy [facility] for tasks assessed/performed           Past Medical History:  Diagnosis Date  . Allergic rhinitis, cause unspecified    on immunotherapy monthly from allergist (pollen, dust, mold, shellfish)  . Bilateral bunions   . COPD (chronic obstructive pulmonary disease) (HCC)    xray/spirometry   . Hyperlipidemia   . Hypertension   . Left sided lacunar stroke (HCC) 2008   thalamus, r facial wkness  . Osteopenia   . Ptosis, both eyelids   . Venous insufficiency     Past Surgical History:  Procedure Laterality Date  . BREAST BIOPSY Left 1976   benign  . COLONOSCOPY      There were no vitals filed for this visit.    Subjective Assessment - 10/15/19 1021    Subjective Pt arriving reporting R knee pain that began about 6 months ago. Pt reproting before pandemic she was going to [facility] about 3 times each week and she was not having any pains. Since pandemic pt has not been able to return to the [facility] and her knee pain has gradualy worsened over the last 6 months. Pt reporting pain at rest is 1/10. Pt reprots after an active day of grocery shopping her pain can increase to 6/10. Pt reporting medial knee pain.    Pertinent History HTN, COPD, 2008 lacunar stroke    How long can you sit  comfortably? 10-15 minutes    How long can you walk comfortably? 15 minutes    Patient Stated Goals not to have pain, be able to build endurance to keep going, return to exercising at [facility]    Currently in Pain? Yes    Pain Score 1     Pain Location Knee    Pain Orientation Right    Pain Descriptors / Indicators Aching    Pain Type Chronic pain    Pain Radiating Towards radiates upward to anterior thigh    Pain Onset More than a month ago    Pain Frequency Intermittent    Aggravating Factors  walking    Pain Relieving Factors sleeping, resting, no pain first thing in the morning              [facility] PT Assessment - 10/15/19 0001      Assessment   Medical Diagnosis Chronic R knee pain M25.561    Referring Provider (PT) Doneen Poisson, MD    Hand Dominance Right      Precautions   Precautions None      Restrictions   Weight Bearing Restrictions No      Home Environment   Living Environment Private residence      Cognition  Overall Cognitive Status Within Functional Limits for tasks assessed      Observation/Other Assessments   Focus on Therapeutic Outcomes (FOTO)  %  Limitation      Posture/Postural Control   Posture/Postural Control Postural limitations    Postural Limitations Rounded Shoulders;Forward head      ROM / Strength   AROM / PROM / Strength AROM;PROM;Strength      AROM   Overall AROM  --    AROM Assessment Site Knee    Right/Left Knee Right;Left    Right Knee Extension -2   hypertension   Right Knee Flexion 130    Left Knee Extension -2   hyperextension   Left Knee Flexion 133      PROM   PROM Assessment Site Knee    Right/Left Knee Right;Left    Right Knee Flexion 135      Strength   Strength Assessment Site Hip;Knee    Right/Left Hip Right;Left    Right Hip Flexion 3+/5    Right Hip Extension 3/5    Right Hip ABduction 3-/5    Right Hip ADduction 3-/5    Left Hip Flexion 3+/5    Left Hip Extension 3+/5    Right/Left Knee  Right;Left    Right Knee Flexion 4+/5    Right Knee Extension 4+/5    Left Knee Flexion 5/5    Left Knee Extension 5/5      Palpation   Palpation comment limitation in lateral patella movement on bilatera knees      Transfers   Five time sit to stand comments  12.14 seconds No UE support      Balance   Balance Assessed Yes      Standardized Balance Assessment   Standardized Balance Assessment Timed Up and Go Test      Timed Up and Go Test   Normal TUG (seconds) 10.5                      Objective measurements completed on examination: See above findings.                    PT Long Term Goals - 10/15/19 1110      PT LONG TERM GOAL #1   Title Pt will be independent in her HEP and progression    Time 6    Period Weeks    Status New    Target Date 11/26/19      PT LONG TERM GOAL #2   Title Pt will be able to tolerate walking 30 minutes with pain </= 2/10.    Time 6    Period Weeks    Status New    Target Date 11/26/19      PT LONG TERM GOAL #3   Title Pt will be able to improve her bilateral hip strength to grossly 4/5 in order to improve functional mobility.    Time 6    Period Weeks    Status New    Target Date 11/26/19                  Plan - 10/15/19 1103    Clinical Impression Statement Pt arriving to therapy for evaluation of R knee pain that has been ongoing over the last 6 or more months. Pt reporting difficulty walking more than 15 minutes before having to sit down. Pt reporting pain along anterior joint line. Pt reporting prior to her pain onset she had been  working out at [employer] with no problems. Pt stated since the pandemic she hasn't been able to attend. Pt presenting with weakness in bilateral hip and R knee. Skilled PT needed to progress pt toward her PLOF and returning to [facility].    Personal Factors and Comorbidities Comorbidity 2    Comorbidities COPD, HTN, h/o lacunar stroke    Examination-Activity  Limitations Stand;Sit;Stairs;Lift    Stability/Clinical Decision Making Stable/Uncomplicated    Clinical Decision Making Low    Rehab Potential Good    PT Frequency 2x / week    PT Duration 6 weeks    PT Treatment/Interventions ADLs/Self Care Home Management;Cryotherapy;Electrical Stimulation;Moist Heat;Iontophoresis 4mg /ml Dexamethasone;Ultrasound;Gait training;Stair training;Functional mobility training;Therapeutic activities;Therapeutic exercise;Balance training;Neuromuscular re-education;Patient/family education;Manual techniques;Passive range of motion;Taping    PT Next Visit Plan Assess hip ROM, Nustep/Bike, LE strengtheing, hip stretching    PT Home Exercise Plan Access Code: [email or web address]    Consulted and Agree with Plan of Care Patient           Patient will benefit from skilled therapeutic intervention in order to improve the following deficits and impairments:  Pain, Decreased mobility, Decreased endurance, Decreased strength  Visit Diagnosis: Chronic pain of right knee - Plan: PT plan of care cert/re-cert  Muscle weakness (generalized) - Plan: PT plan of care cert/re-cert  Difficulty in walking, not elsewhere classified - Plan: PT plan of care cert/re-cert     Problem List Patient Active Problem List   Diagnosis Date Noted  . Allergic rhinitis due to pollen 04/24/2015  . Seasonal allergic conjunctivitis 04/24/2015  . Essential hypertension 04/24/2015  . Essential hypertension, benign 08/05/2011    08/07/2011, PT, MPT 10/15/2019, 11:27 AM  [facility] Physical Therapy [address] [place], [place], [place] Phone: [phone]   Fax:  [phone]  Name: Olivia Richmond MRN: [name] Date of Birth: [birth year]

## 2019-10-20 ENCOUNTER — Ambulatory Visit: Payer: Medicare PPO | Admitting: Rehabilitative and Restorative Service Providers"

## 2019-10-20 ENCOUNTER — Encounter: Payer: Self-pay | Admitting: Rehabilitative and Restorative Service Providers"

## 2019-10-20 ENCOUNTER — Other Ambulatory Visit: Payer: Self-pay

## 2019-10-20 DIAGNOSIS — R262 Difficulty in walking, not elsewhere classified: Secondary | ICD-10-CM

## 2019-10-20 DIAGNOSIS — M6281 Muscle weakness (generalized): Secondary | ICD-10-CM

## 2019-10-20 DIAGNOSIS — G8929 Other chronic pain: Secondary | ICD-10-CM

## 2019-10-20 DIAGNOSIS — M25561 Pain in right knee: Secondary | ICD-10-CM

## 2019-10-20 NOTE — Therapy (Signed)
[facility] Physical Therapy [address] [place], [place], [address] Phone: [phone]   Fax:  [phone]  Physical Therapy Treatment  Patient Details  Name: ITZIA CUNLIFFE MRN: [id number] Date of Birth: [birth year] Referring Provider (PT): Doneen Poisson, MD   Encounter Date: 10/20/2019   PT End of Session - 10/20/19 1027    Visit Number 2    Number of Visits 12    Date for PT Re-Evaluation 11/26/19    Authorization Type humana    Authorization - Visit Number 2    Authorization - Number of Visits 12    PT Start Time 1021    PT Stop Time 1100    PT Time Calculation (min) 39 min    Activity Tolerance Patient tolerated treatment well    Behavior During Therapy [facility] for tasks assessed/performed           Past Medical History:  Diagnosis Date  . Allergic rhinitis, cause unspecified    on immunotherapy monthly from allergist (pollen, dust, mold, shellfish)  . Bilateral bunions   . COPD (chronic obstructive pulmonary disease) (HCC)    xray/spirometry   . Hyperlipidemia   . Hypertension   . Left sided lacunar stroke (HCC) 2008   thalamus, r facial wkness  . Osteopenia   . Ptosis, both eyelids   . Venous insufficiency     Past Surgical History:  Procedure Laterality Date  . BREAST BIOPSY Left 1976   benign  . COLONOSCOPY      There were no vitals filed for this visit.   Subjective Assessment - 10/20/19 1026    Subjective Pt. indicated she was feeling some mild improvement at times.  No pain to report upon arrival today.  Pt. stated lying on side hurt and made that one execise from HEP hard to do.    Pertinent History HTN, COPD, 2008 lacunar stroke    How long can you sit comfortably? 10-15 minutes    How long can you walk comfortably? 15 minutes    Patient Stated Goals not to have pain, be able to build endurance to keep going, return to exercising at [facility]    Currently in Pain? No/denies    Pain Score 0-No pain    Pain Onset More than  a month ago                             [facility] Adult PT Treatment/Exercise - 10/20/19 0001      Neuro Re-ed    Neuro Re-ed Details  tandem ambulation fwd 10 ft x 8 in // bars,       Exercises   Exercises Knee/Hip      Knee/Hip Exercises: Aerobic   Recumbent Bike lvl 2 6 mins      Knee/Hip Exercises: Standing   Other Standing Knee Exercises lateral stepping 10 ft x 4 each way, green band      Knee/Hip Exercises: Seated   Other Seated Knee/Hip Exercises slr 2 x 10 bilateral                  PT Education - 10/20/19 1027    Education Details Review of HEP, adjusted techniques    Person(s) Educated Patient    Methods Explanation;Demonstration;Verbal cues    Comprehension Verbalized understanding;Returned demonstration               PT Long Term Goals - 10/15/19 1110      PT LONG  TERM GOAL #1   Title Pt will be independent in her HEP and progression    Time 6    Period Weeks    Status New    Target Date 11/26/19      PT LONG TERM GOAL #2   Title Pt will be able to tolerate walking 30 minutes with pain </= 2/10.    Time 6    Period Weeks    Status New    Target Date 11/26/19      PT LONG TERM GOAL #3   Title Pt will be able to improve her bilateral hip strength to grossly 4/5 in order to improve functional mobility.    Time 6    Period Weeks    Status New    Target Date 11/26/19                 Plan - 10/20/19 1047    Clinical Impression Statement Rt LE demonstrated strength deficits and endurance deficits noted in intervention compared to Lt at this time.  Fair control noted in non compliant surface balance activity.    Personal Factors and Comorbidities Comorbidity 2    Comorbidities COPD, HTN, h/o lacunar stroke    Examination-Activity Limitations Stand;Sit;Stairs;Lift    Stability/Clinical Decision Making Stable/Uncomplicated    Rehab Potential Good    PT Frequency 2x / week    PT Duration 6 weeks    PT  Treatment/Interventions ADLs/Self Care Home Management;Cryotherapy;Electrical Stimulation;Moist Heat;Iontophoresis 4mg /ml Dexamethasone;Ultrasound;Gait training;Stair training;Functional mobility training;Therapeutic activities;Therapeutic exercise;Balance training;Neuromuscular re-education;Patient/family education;Manual techniques;Passive range of motion;Taping    PT Next Visit Plan Continue LE strengthening, balance control improvements.    PT Home Exercise Plan Access Code: [email or web address]    Consulted and Agree with Plan of Care Patient           Patient will benefit from skilled therapeutic intervention in order to improve the following deficits and impairments:  Pain, Decreased mobility, Decreased endurance, Decreased strength  Visit Diagnosis: Chronic pain of right knee  Muscle weakness (generalized)  Difficulty in walking, not elsewhere classified     Problem List Patient Active Problem List   Diagnosis Date Noted  . Allergic rhinitis due to pollen 04/24/2015  . Seasonal allergic conjunctivitis 04/24/2015  . Essential hypertension 04/24/2015  . Essential hypertension, benign 08/05/2011    08/07/2011, PT, DPT, OCS, ATC 10/20/19  10:52 AM    [facility] Physical Therapy [address] [place], [place], [place] Phone: [phone]   Fax:  [phone]  Name: VEAR STATON MRN: [name] Date of Birth: 09-19-1945

## 2019-10-22 ENCOUNTER — Ambulatory Visit: Payer: Medicare PPO | Attending: Internal Medicine

## 2019-10-22 DIAGNOSIS — Z23 Encounter for immunization: Secondary | ICD-10-CM

## 2019-10-22 NOTE — Progress Notes (Signed)
   Covid-19 Vaccination Clinic  Name:  Olivia Richmond    MRN: [id number] DOB: [birth year]  10/22/2019  Olivia Richmond was observed post Covid-19 immunization for 15 minutes without incident. She was provided with Vaccine Information Sheet and instruction to access the V-Safe system.   Olivia Richmond was instructed to call 911 with any severe reactions post vaccine: Marland Kitchen Difficulty breathing  . Swelling of face and throat  . A fast heartbeat  . A bad rash all over body  . Dizziness and weakness

## 2019-11-02 ENCOUNTER — Ambulatory Visit: Payer: Medicare PPO | Admitting: Orthopaedic Surgery

## 2019-11-02 ENCOUNTER — Encounter: Payer: Self-pay | Admitting: Orthopaedic Surgery

## 2019-11-02 DIAGNOSIS — M25561 Pain in right knee: Secondary | ICD-10-CM

## 2019-11-02 DIAGNOSIS — G8929 Other chronic pain: Secondary | ICD-10-CM | POA: Diagnosis not present

## 2019-11-02 NOTE — Progress Notes (Signed)
The patient comes in today for follow-up as it relates to her knee arthritis.  This is with her right knee.  I have provided a steroid injection in that knee.  She has been to physical therapy as well.  She said that she is doing well overall and her knee is not really bothering her hurting.  On examination of the right knee today there is no effusion.  Her range of motion is full and stable.  There is no pain throughout the arc of motion.  At this point follow-up can be as needed since she is doing well.  All questions and concerns were answered and addressed.

## 2019-11-03 ENCOUNTER — Encounter: Payer: Medicare PPO | Admitting: Physical Therapy

## 2019-11-03 ENCOUNTER — Telehealth: Payer: Self-pay | Admitting: Physical Therapy

## 2019-11-03 NOTE — Telephone Encounter (Signed)
Pt no show for PT appointment today. They were contacted and informed of this and she states she forgot about her appointment but confirmed she will be at her next one.  Ivery Quale, PT, DPT 11/03/19 12:09 PM

## 2019-11-05 ENCOUNTER — Ambulatory Visit: Payer: Medicare PPO | Admitting: Rehabilitative and Restorative Service Providers"

## 2019-11-05 ENCOUNTER — Encounter: Payer: Self-pay | Admitting: Rehabilitative and Restorative Service Providers"

## 2019-11-05 ENCOUNTER — Other Ambulatory Visit: Payer: Self-pay

## 2019-11-05 DIAGNOSIS — R262 Difficulty in walking, not elsewhere classified: Secondary | ICD-10-CM

## 2019-11-05 DIAGNOSIS — G8929 Other chronic pain: Secondary | ICD-10-CM

## 2019-11-05 DIAGNOSIS — M6281 Muscle weakness (generalized): Secondary | ICD-10-CM | POA: Diagnosis not present

## 2019-11-05 DIAGNOSIS — M25561 Pain in right knee: Secondary | ICD-10-CM

## 2019-11-05 NOTE — Therapy (Signed)
[facility] Physical Therapy [address] [place], [place], [address] Phone: [phone]   Fax:  [phone]  Physical Therapy Treatment  Patient Details  Name: Olivia Richmond MRN: [id number] Date of Birth: [birth year] Referring Provider (PT): Doneen Poisson, MD   Encounter Date: 11/05/2019   PT End of Session - 11/05/19 0935    Visit Number 3    Number of Visits 12    Date for PT Re-Evaluation 11/26/19    Authorization Type humana    Authorization - Visit Number 3    Authorization - Number of Visits 12    PT Start Time 0932    PT Stop Time 1012    PT Time Calculation (min) 40 min    Activity Tolerance Patient tolerated treatment well    Behavior During Therapy [facility] for tasks assessed/performed           Past Medical History:  Diagnosis Date   Allergic rhinitis, cause unspecified    on immunotherapy monthly from allergist (pollen, dust, mold, shellfish)   Bilateral bunions    COPD (chronic obstructive pulmonary disease) (HCC)    xray/spirometry    Hyperlipidemia    Hypertension    Left sided lacunar stroke (HCC) 2008   thalamus, r facial wkness   Osteopenia    Ptosis, both eyelids    Venous insufficiency     Past Surgical History:  Procedure Laterality Date   BREAST BIOPSY Left 1976   benign   COLONOSCOPY      There were no vitals filed for this visit.   Subjective Assessment - 11/05/19 0935    Subjective Pt. indicated no pain today upon arrival.  Insidious onset of some mild pain yesterday but overall doing ok.    Pertinent History HTN, COPD, 2008 lacunar stroke    How long can you sit comfortably? 10-15 minutes    How long can you walk comfortably? 15 minutes    Patient Stated Goals not to have pain, be able to build endurance to keep going, return to exercising at [facility]    Currently in Pain? No/denies    Pain Score 0-No pain    Pain Onset More than a month ago                             [facility]  Adult PT Treatment/Exercise - 11/05/19 0001      Neuro Re-ed    Neuro Re-ed Details  tandem stance 90 sec x 1 bilateral, heel/toe raise alternating 20x      Knee/Hip Exercises: Stretches   Gastroc Stretch 30 seconds;3 reps   incline board bilateral     Knee/Hip Exercises: Aerobic   Recumbent Bike Lvl 2 6 mins      Knee/Hip Exercises: Standing   Forward Step Up 20 reps;Both;Step Height: 6"      Knee/Hip Exercises: Seated   Other Seated Knee/Hip Exercises slr 2 x 10 bilateral    Sit to Sand without UE support;20 reps   18 inch chair                      PT Long Term Goals - 10/15/19 1110      PT LONG TERM GOAL #1   Title Pt will be independent in her HEP and progression    Time 6    Period Weeks    Status New    Target Date 11/26/19      PT LONG  TERM GOAL #2   Title Pt will be able to tolerate walking 30 minutes with pain </= 2/10.    Time 6    Period Weeks    Status New    Target Date 11/26/19      PT LONG TERM GOAL #3   Title Pt will be able to improve her bilateral hip strength to grossly 4/5 in order to improve functional mobility.    Time 6    Period Weeks    Status New    Target Date 11/26/19                 Plan - 11/05/19 0947    Clinical Impression Statement Presentation today c minimal symptoms complaints overall between visits and no complaints while in clinic.  Pt. to continue to benefit from improved HEP performance and routine for self management for possible transition to HEP in future visits due to improvements.    Personal Factors and Comorbidities Comorbidity 2    Comorbidities COPD, HTN, h/o lacunar stroke    Examination-Activity Limitations Stand;Sit;Stairs;Lift    Stability/Clinical Decision Making Stable/Uncomplicated    Rehab Potential Good    PT Frequency 2x / week    PT Duration 6 weeks    PT Treatment/Interventions ADLs/Self Care Home Management;Cryotherapy;Electrical Stimulation;Moist Heat;Iontophoresis 4mg /ml  Dexamethasone;Ultrasound;Gait training;Stair training;Functional mobility training;Therapeutic activities;Therapeutic exercise;Balance training;Neuromuscular re-education;Patient/family education;Manual techniques;Passive range of motion;Taping    PT Next Visit Plan Continue LE strengthening, balance control improvements.    PT Home Exercise Plan Access Code: [email or web address]    Consulted and Agree with Plan of Care Patient           Patient will benefit from skilled therapeutic intervention in order to improve the following deficits and impairments:  Pain, Decreased mobility, Decreased endurance, Decreased strength  Visit Diagnosis: Chronic pain of right knee  Muscle weakness (generalized)  Difficulty in walking, not elsewhere classified     Problem List Patient Active Problem List   Diagnosis Date Noted   Allergic rhinitis due to pollen 04/24/2015   Seasonal allergic conjunctivitis 04/24/2015   Essential hypertension 04/24/2015   Essential hypertension, benign 08/05/2011    08/07/2011, PT, DPT, OCS, ATC 11/05/19  10:02 AM    [facility] Physical Therapy [address] [place], [place], [place] Phone: [phone]   Fax:  [phone]  Name: Olivia Richmond MRN: [name] Date of Birth: 17-Aug-1945

## 2019-11-10 ENCOUNTER — Encounter: Payer: Medicare PPO | Admitting: Rehabilitative and Restorative Service Providers"

## 2019-11-10 ENCOUNTER — Other Ambulatory Visit: Payer: Self-pay

## 2019-11-10 ENCOUNTER — Encounter: Payer: Self-pay | Admitting: Rehabilitative and Restorative Service Providers"

## 2019-11-10 ENCOUNTER — Ambulatory Visit (INDEPENDENT_AMBULATORY_CARE_PROVIDER_SITE_OTHER): Payer: Medicare PPO | Admitting: Rehabilitative and Restorative Service Providers"

## 2019-11-10 DIAGNOSIS — M6281 Muscle weakness (generalized): Secondary | ICD-10-CM | POA: Diagnosis not present

## 2019-11-10 DIAGNOSIS — G8929 Other chronic pain: Secondary | ICD-10-CM

## 2019-11-10 DIAGNOSIS — M25561 Pain in right knee: Secondary | ICD-10-CM | POA: Diagnosis not present

## 2019-11-10 DIAGNOSIS — M25661 Stiffness of right knee, not elsewhere classified: Secondary | ICD-10-CM | POA: Diagnosis not present

## 2019-11-10 DIAGNOSIS — R262 Difficulty in walking, not elsewhere classified: Secondary | ICD-10-CM | POA: Diagnosis not present

## 2019-11-10 NOTE — Therapy (Addendum)
[facility] Physical Therapy [address] [place], [place], [address] Phone: [phone]   Fax:  [phone]  Physical Therapy Treatment  Patient Details  Name: Olivia Richmond MRN: [id number] Date of Birth: May 17, 1945 Referring Provider (PT): Jean Rosenthal, MD  PHYSICAL THERAPY DISCHARGE SUMMARY  Visits from Start of Care: 4  Current functional level related to goals / functional outcomes: See note   Remaining deficits: See note   Education / Equipment: HEP Plan: Patient agrees to discharge.  Patient goals were partially met. Patient is being discharged due to being pleased with the current functional level.  ?????     Encounter Date: 11/10/2019   PT End of Session - 11/10/19 1145    Visit Number 4    Number of Visits 12    Date for PT Re-Evaluation 11/26/19    Authorization Type humana    Authorization - Visit Number 4    Authorization - Number of Visits 12    PT Start Time 5681    PT Stop Time 1145    PT Time Calculation (min) 33 min    Activity Tolerance Patient tolerated treatment well;No increased pain    Behavior During Therapy [facility] for tasks assessed/performed           Past Medical History:  Diagnosis Date  . Allergic rhinitis, cause unspecified    on immunotherapy monthly from allergist (pollen, dust, mold, shellfish)  . Bilateral bunions   . COPD (chronic obstructive pulmonary disease) ([place])    xray/spirometry   . Hyperlipidemia   . Hypertension   . Left sided lacunar stroke ([place]) 2008   thalamus, r facial wkness  . Osteopenia   . Ptosis, both eyelids   . Venous insufficiency     Past Surgical History:  Procedure Laterality Date  . BREAST BIOPSY Left 1976   benign  . COLONOSCOPY      There were no vitals filed for this visit.   Subjective Assessment - 11/10/19 1140    Subjective Olivia Richmond notes progress with her knee pain since starting PT.    Pertinent History HTN, COPD, 2008 lacunar stroke    How long can you  sit comfortably? 10-15 minutes    How long can you walk comfortably? 15 minutes    Patient Stated Goals Not to have pain, be able to build endurance to keep going, return to exercising at [facility]    Currently in Pain? No/denies    Pain Onset More than a month ago    Aggravating Factors  WB    Pain Relieving Factors Exercises    Multiple Pain Sites No                             [facility] Adult PT Treatment/Exercise - 11/10/19 0001      Therapeutic Activites    Therapeutic Activities Other Therapeutic Activities   Step up/over & up/down 2 sets of 10 each 4 and 6 inch     Knee/Hip Exercises: Aerobic   Recumbent Bike Seat 4; Level 5; 8 minutes      Knee/Hip Exercises: Seated   Sit to Sand 10 reps;without UE support   slow eccentrics                 PT Education - 11/10/19 1141    Education Details Reviewed functional home exercises with emphasis on sit to stand and steps/curbs.    Person(s) Educated Patient    Methods Explanation;Demonstration;Verbal cues  Comprehension Verbalized understanding;Returned demonstration;Need further instruction;Verbal cues required               PT Long Term Goals - 11/10/19 1142      PT LONG TERM GOAL #1   Title Pt will be independent in her HEP and progression    Time 6    Period Weeks    Status On-going      PT LONG TERM GOAL #2   Title Pt will be able to tolerate walking 30 minutes with pain </= 2/10.    Time 6    Period Weeks    Status Achieved      PT LONG TERM GOAL #3   Title Pt will be able to improve her bilateral hip strength to grossly 4/5 in order to improve functional mobility.    Time 6    Period Weeks    Status Achieved                 Plan - 11/10/19 1146    Clinical Impression Statement Olivia Richmond was late to her appointment today so treatment time was limited.  She is doing a good job with her HEP and she expressed interest in more independent rehabilitation.  She will try exercise at home  and call us if she wishes to return for further supervised visits.  Her HEP was reviewed and discussed.    Personal Factors and Comorbidities Comorbidity 2    Comorbidities COPD, HTN, h/o lacunar stroke    Examination-Activity Limitations Stand;Sit;Stairs;Lift    Stability/Clinical Decision Making Stable/Uncomplicated    Rehab Potential Good    PT Frequency 2x / week    PT Duration 6 weeks    PT Treatment/Interventions ADLs/Self Care Home Management;Cryotherapy;Electrical Stimulation;Moist Heat;Iontophoresis 70m/ml Dexamethasone;Ultrasound;Gait training;Stair training;Functional mobility training;Therapeutic activities;Therapeutic exercise;Balance training;Neuromuscular re-education;Patient/family education;Manual techniques;Passive range of motion;Taping    PT Next Visit Plan Continue LE strengthening, balance control improvements.    PT Home Exercise Plan Access Code: [email or web address]    Consulted and Agree with Plan of Care Patient           Patient will benefit from skilled therapeutic intervention in order to improve the following deficits and impairments:  Pain, Decreased mobility, Decreased endurance, Decreased strength  Visit Diagnosis: Difficulty walking  Stiffness of right knee, not elsewhere classified  Chronic pain of right knee  Muscle weakness (generalized)     Problem List Patient Active Problem List   Diagnosis Date Noted  . Allergic rhinitis due to pollen 04/24/2015  . Seasonal allergic conjunctivitis 04/24/2015  . Essential hypertension 04/24/2015  . Essential hypertension, benign 08/05/2011    RFarley LyPT, MPT 11/10/2019, 11:48 AM  [facility] [address] [address]Phone: [phone]  Fax:  [phone] Name: Olivia CROTTEAUMRN: [id number] of Birth: [birth year]

## 2019-11-12 ENCOUNTER — Encounter: Payer: Medicare PPO | Admitting: Rehabilitative and Restorative Service Providers"

## 2019-11-17 ENCOUNTER — Encounter: Payer: Medicare PPO | Admitting: Physical Therapy

## 2019-11-19 ENCOUNTER — Encounter: Payer: Medicare PPO | Admitting: Rehabilitative and Restorative Service Providers"

## 2019-11-24 ENCOUNTER — Encounter: Payer: Medicare PPO | Admitting: Physical Therapy

## 2019-11-26 ENCOUNTER — Encounter: Payer: Medicare PPO | Admitting: Physical Therapy

## 2022-04-15 ENCOUNTER — Ambulatory Visit: Payer: Medicare PPO | Admitting: Dermatology

## 2022-05-24 ENCOUNTER — Ambulatory Visit
Admission: EM | Admit: 2022-05-24 | Discharge: 2022-05-24 | Disposition: A | Discharge status: Home / Self Care | Payer: Medicare PPO | Attending: Family Medicine | Admitting: Family Medicine

## 2022-05-24 DIAGNOSIS — R609 Edema, unspecified: Secondary | ICD-10-CM | POA: Diagnosis not present

## 2022-05-24 MED ORDER — CHLORTHALIDONE 25 MG PO TABS: 12.5000 mg | ORAL_TABLET | Freq: Every day | ORAL | 0 refills | Status: AC

## 2022-05-24 NOTE — Discharge Instructions (Signed)
You were seen today for swelling of your feet and ankles.  I have restarted your chlorthalidone, 1/2 tab daily x 10 days.  Please call your primary care provider for follow up and discussion if you should restart this medication long term.  If you have worsening symptoms then please return for further evaluation.

## 2022-05-24 NOTE — ED Provider Notes (Signed)
[facility]    CSN: [id number] Arrival date & time: 05/24/22  0816      History   Chief Complaint Chief Complaint  Patient presents with   Joint Swelling    HPI Olivia Richmond is a 77 y.o. female.   Patient is here for ankle/foot swelling.  Right is worse than the left.  Worsens throughout the day when she is up on her feet, and has pain, which is unbearable at times.  No known injuries.  This has been going on x 3 days.  No dietary changes, no increase in salt intake.  Reviewed her med list today.  She was on chlorthalidone in the past, stopped that last fall by the direction of her doctor.        Past Medical History:  Diagnosis Date   Allergic rhinitis, cause unspecified    on immunotherapy monthly from allergist (pollen, dust, mold, shellfish)   Bilateral bunions    COPD (chronic obstructive pulmonary disease) (HCC)    xray/spirometry    Hyperlipidemia    Hypertension    Left sided lacunar stroke (HCC) 2008   thalamus, r facial wkness   Osteopenia    Ptosis, both eyelids    Venous insufficiency     Patient Active Problem List   Diagnosis Date Noted   Allergic rhinitis due to pollen 04/24/2015   Seasonal allergic conjunctivitis 04/24/2015   Essential hypertension 04/24/2015   Essential hypertension, benign 08/05/2011    Past Surgical History:  Procedure Laterality Date   BREAST BIOPSY Left 1976   benign   COLONOSCOPY      OB History   No obstetric history on file.      Home Medications    Prior to Admission medications   Medication Sig Start Date End Date Taking? Authorizing Provider  aspirin 81 MG tablet Take 81 mg by mouth daily.    [provider]  atorvastatin (LIPITOR) 20 MG tablet Take 20 mg by mouth daily.    [provider]  chlorthalidone (HYGROTON) 25 MG tablet Take 12.5 mg by mouth daily.    [provider]  cholecalciferol (VITAMIN D) 1000 UNITS tablet Take 1,000 Units by mouth daily.     [provider]  ciprofloxacin (CIPRO) 250 MG tablet Take 1 tablet (250 mg total) by mouth 2 (two) times daily. 02/21/13   Collene Gobble, MD  CRANBERRY PO Take 2 capsules by mouth daily. Reported on 04/24/2015    [provider]  fexofenadine (ALLEGRA) 180 MG tablet Take 180 mg by mouth daily.    [provider]  fluticasone (FLONASE) 50 MCG/ACT nasal spray Two Sprays into each nostril once daily for stuffy nose. 05/31/15   Fletcher Anon, MD  ketotifen (ZADITOR) 0.025 % ophthalmic solution 1 drop as needed.    [provider]  losartan (COZAAR) 50 MG tablet Take 50 mg by mouth daily.    [provider]  meloxicam (MOBIC) 15 MG tablet Take 1 tablet (15 mg total) by mouth daily. 08/23/19   Kathryne Hitch, MD  methylPREDNISolone (MEDROL) 4 MG tablet Medrol dose pack. Take as instructed 08/23/19   Kathryne Hitch, MD  Multiple Vitamins-Minerals (MULTIVITAMIN WITH MINERALS) tablet Take 2 tablets by mouth daily.    [provider]  UNABLE TO FIND Med Name: allergy shots once a month    [provider]    Family History Family History  Problem Relation Age of Onset   Kidney disease Mother  Hypertension Mother    Heart disease Father    Hypertension Father    Cancer Sister 29       breast cancer   Hypertension Sister    Hyperlipidemia Sister    Hypertension Brother    Heart disease Brother        atrial fibrillation   Arthritis Brother        hip replacement with ongoing pain; disabled   Diabetes Neg Hx    Colon cancer Neg Hx    Hypertension Sister    Hypertension Brother    Heart disease Brother        MI   Heart disease Brother        MI    Social History Social History   Tobacco Use   Smoking status: Never   Smokeless tobacco: Never  Substance Use Topics   Alcohol use: No   Drug use: No     Allergies   Shellfish allergy   Review of Systems Review of Systems  Constitutional: Negative.    HENT: Negative.    Respiratory: Negative.    Cardiovascular: Negative.   Gastrointestinal: Negative.   Musculoskeletal:  Positive for joint swelling.     Physical Exam Triage Vital Signs ED Triage Vitals [05/24/22 0909]  Enc Vitals Group     BP 127/85     Pulse Rate 68     Resp 16     Temp 97.6 F (36.4 C)     Temp Source Oral     SpO2 98 %     Weight      Height      Head Circumference      Peak Flow      Pain Score 7     Pain Loc      Pain Edu?      Excl. in GC?    No data found.  Updated Vital Signs BP 127/85 (BP Location: Left Arm)   Pulse 68   Temp 97.6 F (36.4 C) (Oral)   Resp 16   SpO2 98%   Visual Acuity Right Eye Distance:   Left Eye Distance:   Bilateral Distance:    Right Eye Near:   Left Eye Near:    Bilateral Near:     Physical Exam Constitutional:      Appearance: Normal appearance.  Cardiovascular:     Rate and Rhythm: Normal rate and regular rhythm.  Pulmonary:     Effort: Pulmonary effort is normal.     Breath sounds: Normal breath sounds.  Musculoskeletal:     Right lower leg: Edema present.     Left lower leg: No edema.     Comments: There is swelling to the right lateral ankle;   no warmth or erythema noted;  no tenderness noted at this time.   Skin:    General: Skin is warm.  Neurological:     General: No focal deficit present.     Mental Status: She is alert.  Psychiatric:        Mood and Affect: Mood normal.      UC Treatments / Results  Labs (all labs ordered are listed, but only abnormal results are displayed) Labs Reviewed - No data to display  EKG   Radiology No results found.  Procedures Procedures (including critical care time)  Medications Ordered in UC Medications - No data to display  Initial Impression / Assessment and Plan / UC Course  I have reviewed the triage vital signs and  the nursing notes.  Pertinent labs & imaging results that were available during my care of the patient were  reviewed by me and considered in my medical decision making (see chart for details).   Final Clinical Impressions(s) / UC Diagnoses   Final diagnoses:  Edema, unspecified type     Discharge Instructions      You were seen today for swelling of your feet and ankles.  I have restarted your chlorthalidone, 1/2 tab daily x 10 days.  Please call your primary care provider for follow up and discussion if you should restart this medication long term.  If you have worsening symptoms then please return for further evaluation.      ED Prescriptions     Medication Sig Dispense Auth. Provider   chlorthalidone (HYGROTON) 25 MG tablet Take 0.5 tablets (12.5 mg total) by mouth daily. 5 tablet Jannifer Franklin, MD      PDMP not reviewed this encounter.   Jannifer Franklin, MD 05/24/22 [phone]

## 2022-05-24 NOTE — ED Triage Notes (Signed)
Patient with c/o bilateral ankle swelling for about 3 days. Right ankle significantly more swollen than the left. Patient denies injury. States the swelling is worse at night after she has been on her feet all day and it becomes painful.

## 2022-10-14 ENCOUNTER — Ambulatory Visit
Admission: EM | Admit: 2022-10-14 | Discharge: 2022-10-14 | Disposition: A | Discharge status: Home / Self Care | Payer: Medicare PPO | Attending: Internal Medicine | Admitting: Internal Medicine

## 2022-10-14 ENCOUNTER — Encounter: Payer: Self-pay | Admitting: Emergency Medicine

## 2022-10-14 ENCOUNTER — Ambulatory Visit: Payer: Medicare PPO

## 2022-10-14 DIAGNOSIS — M25561 Pain in right knee: Secondary | ICD-10-CM | POA: Diagnosis not present

## 2022-10-14 NOTE — Discharge Instructions (Addendum)
X-ray does not appear to show any fractures or dislocations however the final read of the x-ray may take several hours.  If anything abnormal shows up we will contact you.  We will prescribe the following treatment: Use knee compression sleeve when up walking but may remove at night.  Keep a bandage over the abrasion to prevent irritation. May use ice 10 to 15 minutes 2-3 times daily but do not apply the ice directly to the skin May use Tylenol for discomfort as directed on the bottle Return to urgent care or PCP if symptoms worsen or fail to resolve.

## 2022-10-14 NOTE — ED Triage Notes (Signed)
Patient states that she fell last Wednesday 10/09/2022, injuring her right knee.  Right knee does have an abrasion on it.  Last 2 nights, pain has increased.  Applying Neosporin and taken ASA for pain.

## 2022-10-14 NOTE — ED Provider Notes (Signed)
[facility]    CSN: [id number] Arrival date & time: 10/14/22  1252      History   Chief Complaint Chief Complaint  Patient presents with   Fall    HPI Olivia Richmond is a 77 y.o. female.   77 year old female who presents to urgent care with complaints of right knee pain.  She fell on Wednesday last week on concrete.  She landed straight down on her knee without twisting.  She was able to get up and walk right after but reports walking with a slight limp.  She reports last 2 days it has been more painful with sharp pains at times.  The pain has waking her up at night but does not last more than a minute or 2 at most.  She denies any previous injury to the knee however does relate she has been told she has fairly significant arthritis in that knee.  She did suffer a abrasion to the anterior knee as well as a bruise to her face from the fall.  She denies any loss of consciousness or other injury.   Fall Pertinent negatives include no chest pain, no abdominal pain and no shortness of breath.    Past Medical History:  Diagnosis Date   Allergic rhinitis, cause unspecified    on immunotherapy monthly from allergist (pollen, dust, mold, shellfish)   Bilateral bunions    COPD (chronic obstructive pulmonary disease) (HCC)    xray/spirometry    Hyperlipidemia    Hypertension    Left sided lacunar stroke (HCC) 2008   thalamus, r facial wkness   Osteopenia    Ptosis, both eyelids    Venous insufficiency     Patient Active Problem List   Diagnosis Date Noted   Allergic rhinitis due to pollen 04/24/2015   Seasonal allergic conjunctivitis 04/24/2015   Essential hypertension 04/24/2015   Essential hypertension, benign 08/05/2011    Past Surgical History:  Procedure Laterality Date   BREAST BIOPSY Left 1976   benign   COLONOSCOPY      OB History   No obstetric history on file.      Home Medications    Prior to Admission medications   Medication Sig Start  Date End Date Taking? Authorizing Provider  aspirin 81 MG tablet Take 81 mg by mouth daily.   Yes [provider]  cholecalciferol (VITAMIN D) 1000 UNITS tablet Take 1,000 Units by mouth daily.   Yes [provider]  CRANBERRY PO Take 2 capsules by mouth daily. Reported on 04/24/2015   Yes [provider]  fexofenadine (ALLEGRA) 180 MG tablet Take 180 mg by mouth daily.   Yes [provider]  fluticasone (FLONASE) 50 MCG/ACT nasal spray Two Sprays into each nostril once daily for stuffy nose. 05/31/15  Yes Bardelas, Bonnita Hollow, MD  ketotifen (ZADITOR) 0.025 % ophthalmic solution 1 drop as needed.   Yes [provider]  losartan (COZAAR) 50 MG tablet Take 50 mg by mouth daily.   Yes [provider]  meloxicam (MOBIC) 15 MG tablet Take 1 tablet (15 mg total) by mouth daily. 08/23/19  Yes Kathryne Hitch, MD  Multiple Vitamins-Minerals (MULTIVITAMIN WITH MINERALS) tablet Take 2 tablets by mouth daily.   Yes [provider]  UNABLE TO FIND Med Name: allergy shots once a month   Yes [provider]  chlorthalidone (HYGROTON) 25 MG tablet Take 0.5 tablets (12.5 mg total) by mouth daily. 05/24/22   Jannifer Franklin, MD  Family History Family History  Problem Relation Age of Onset   Kidney disease Mother    Hypertension Mother    Heart disease Father    Hypertension Father    Cancer Sister 4       breast cancer   Hypertension Sister    Hyperlipidemia Sister    Hypertension Brother    Heart disease Brother        atrial fibrillation   Arthritis Brother        hip replacement with ongoing pain; disabled   Diabetes Neg Hx    Colon cancer Neg Hx    Hypertension Sister    Hypertension Brother    Heart disease Brother        MI   Heart disease Brother        MI    Social History Social History   Tobacco Use   Smoking status: Never   Smokeless tobacco: Never  Vaping Use   Vaping status: Never Used  Substance Use  Topics   Alcohol use: No   Drug use: No     Allergies   Shellfish allergy   Review of Systems Review of Systems  Constitutional:  Negative for chills and fever.  HENT:  Negative for ear pain and sore throat.   Eyes:  Negative for pain and visual disturbance.  Respiratory:  Negative for cough and shortness of breath.   Cardiovascular:  Negative for chest pain and palpitations.  Gastrointestinal:  Negative for abdominal pain and vomiting.  Genitourinary:  Negative for dysuria and hematuria.  Musculoskeletal:  Positive for joint swelling (Right knee). Negative for arthralgias and back pain.  Skin:  Positive for wound (Right knee). Negative for color change and rash.  Neurological:  Negative for seizures and syncope.  All other systems reviewed and are negative.    Physical Exam Triage Vital Signs ED Triage Vitals  Encounter Vitals Group     BP 10/14/22 1441 (!) 142/68     Systolic BP Percentile --      Diastolic BP Percentile --      Pulse Rate 10/14/22 1441 61     Resp 10/14/22 1441 18     Temp 10/14/22 1441 (!) 97.5 F (36.4 C)     Temp Source 10/14/22 1441 Oral     SpO2 10/14/22 1441 98 %     Weight --      Height --      Head Circumference --      Peak Flow --      Pain Score 10/14/22 1443 1     Pain Loc --      Pain Education --      Exclude from Growth Chart --    No data found.  Updated Vital Signs BP (!) 142/68 (BP Location: Left Arm)   Pulse 61   Temp (!) 97.5 F (36.4 C) (Oral)   Resp 18   SpO2 98%   Visual Acuity Right Eye Distance:   Left Eye Distance:   Bilateral Distance:    Right Eye Near:   Left Eye Near:    Bilateral Near:     Physical Exam Vitals and nursing note reviewed.  Constitutional:      General: She is not in acute distress.    Appearance: She is well-developed.  HENT:     Head: Normocephalic and atraumatic.  Eyes:     Conjunctiva/sclera: Conjunctivae normal.  Cardiovascular:     Rate and Rhythm: Normal rate and  regular  rhythm.     Heart sounds: No murmur heard. Pulmonary:     Effort: Pulmonary effort is normal. No respiratory distress.     Breath sounds: Normal breath sounds.  Abdominal:     Palpations: Abdomen is soft.     Tenderness: There is no abdominal tenderness.  Musculoskeletal:        General: No swelling.     Cervical back: Neck supple.     Right knee: Swelling and laceration present. No deformity, effusion, erythema or ecchymosis. Normal range of motion. Tenderness present. No MCL laxity or ACL laxity. Normal pulse.     Instability Tests: Anterior drawer test negative. Posterior drawer test negative. Medial McMurray test negative and lateral McMurray test negative.  Skin:    General: Skin is warm and dry.     Capillary Refill: Capillary refill takes less than 2 seconds.  Neurological:     Mental Status: She is alert.  Psychiatric:        Mood and Affect: Mood normal.     UC Treatments / Results  Labs (all labs ordered are listed, but only abnormal results are displayed) Labs Reviewed - No data to display  EKG   Radiology No results found.  Procedures Procedures (including critical care time)  Medications Ordered in UC Medications - No data to display  Initial Impression / Assessment and Plan / UC Course  I have reviewed the triage vital signs and the nursing notes.  Pertinent labs & imaging results that were available during my care of the patient were reviewed by me and considered in my medical decision making (see chart for details).     Acute pain of right knee X-ray does not appear to show any fractures or dislocations however the final read of the x-ray may take several hours.  If anything abnormal shows up we will contact you.  We will prescribe the following treatment: Use knee compression sleeve when up walking but may remove at night.  Keep a bandage over the abrasion to prevent irritation. May use ice 10 to 15 minutes 2-3 times daily but do not apply  the ice directly to the skin May use Tylenol for discomfort as directed on the bottle Return to urgent care or PCP if symptoms worsen or fail to resolve.   Final Clinical Impressions(s) / UC Diagnoses   Final diagnoses:  None   Discharge Instructions   None    ED Prescriptions   None    PDMP not reviewed this encounter.   Landis Martins, [place] 10/14/22 1558

## 2022-11-26 ENCOUNTER — Encounter: Payer: Self-pay | Admitting: Internal Medicine

## 2023-03-27 ENCOUNTER — Telehealth: Payer: Self-pay | Admitting: Internal Medicine

## 2023-03-27 NOTE — Telephone Encounter (Signed)
 78 yo asking to repeat screening colonoscopuy - negative exam 2014  OK to schedule direct in [facility]

## 2023-03-28 NOTE — Telephone Encounter (Signed)
 I spoke with Olivia Richmond and she wasn't at home and didn't have her calendar. She took our number and is going to call back next week to set up the pre-visit and colonoscopy appointments. I have records that I will get scanned into epic.

## 2023-04-01 ENCOUNTER — Ambulatory Visit: Admitting: Podiatry

## 2023-04-02 ENCOUNTER — Ambulatory Visit: Admitting: Podiatry

## 2023-04-02 ENCOUNTER — Encounter: Payer: Self-pay | Admitting: Podiatry

## 2023-04-02 DIAGNOSIS — D2372 Other benign neoplasm of skin of left lower limb, including hip: Secondary | ICD-10-CM

## 2023-04-02 NOTE — Progress Notes (Signed)
  Subjective:  Patient ID: [name], female    DOB: 15-Mar-1945,   MRN: [id number]  No chief complaint on file.   78 y.o. female presents for concern of lesions on bilateral feet that have been present for a while and have been very painful for her. She denies any current treatments.    . Denies any other pedal complaints. Denies n/v/f/c.   Past Medical History:  Diagnosis Date   Allergic rhinitis, cause unspecified    on immunotherapy monthly from allergist (pollen, dust, mold, shellfish)   Bilateral bunions    COPD (chronic obstructive pulmonary disease) (HCC)    xray/spirometry    Hyperlipidemia    Hypertension    Left sided lacunar stroke (HCC) 2008   thalamus, r facial wkness   Osteopenia    Ptosis, both eyelids    Venous insufficiency     Objective:  Physical Exam: Vascular: DP/PT pulses 2/4 bilateral. CFT <3 seconds. Normal hair growth on digits. No edema.  Skin. No lacerations or abrasions bilateral feet. Hyperkeratotic cored lesion noted sub fifth metatrsal base on left with disruption of skin lines. Mild hyperkeratotic lesion noted sub third metatarsal on right. Lateral second digit with hyperkeratosis as well Musculoskeletal: MMT 5/5 bilateral lower extremities in DF, PF, Inversion and Eversion. Deceased ROM in DF of ankle joint.  Neurological: Sensation intact to light touch.   Assessment:   1. Benign neoplasm of skin of foot, left      Plan:  Patient was evaluated and treated and all questions answered. -Discussed benign skin lesions with patient and treatment options.  -Hyperkeratotic tissue was debrided with chisel without incident.  -Applied salycylic acid treatment to area with dressing. Advised to remove bandaging tomorrow.  -Encouraged daily moisturizing -Discussed use of pumice stone -Advised good supportive shoes and inserts -Patient to return to office as needed or sooner if condition worsens.   Louann Sjogren, DPM

## 2023-04-09 NOTE — Telephone Encounter (Signed)
 I left her a detailed message to please call us back and set up her colonoscopy and pre-visit.

## 2023-04-21 NOTE — Telephone Encounter (Signed)
 Both of her numbers ring and ring. Unable to reach [place]. If she doesn't call back soon I will mail her a letter to call us.

## 2023-04-24 NOTE — Telephone Encounter (Signed)
 I have mailed her a letter to call us. I will put her records in my folder.

## 2023-10-06 ENCOUNTER — Encounter: Payer: Self-pay | Admitting: Podiatry

## 2023-10-06 ENCOUNTER — Ambulatory Visit: Admitting: Podiatry

## 2023-10-06 DIAGNOSIS — D2372 Other benign neoplasm of skin of left lower limb, including hip: Secondary | ICD-10-CM

## 2023-10-06 NOTE — Progress Notes (Signed)
  Subjective:  Patient ID: Olivia Richmond, female    DOB: 03/08/1945,   MRN: [id number]  Chief Complaint  Patient presents with   Callouses    I'm here for these corns and these growths on my feet.  It's mostly the left foot.    78 y.o. female presents for concern of lesions on bilateral feet that have been present for a while and have been very painful for her. She denies any current treatments.  Has had them trimmed int he past which has helped.   . Denies any other pedal complaints. Denies n/v/f/c.   Past Medical History:  Diagnosis Date   Allergic rhinitis, cause unspecified    on immunotherapy monthly from allergist (pollen, dust, mold, shellfish)   Bilateral bunions    COPD (chronic obstructive pulmonary disease) (HCC)    xray/spirometry    Hyperlipidemia    Hypertension    Left sided lacunar stroke (HCC) 2008   thalamus, r facial wkness   Osteopenia    Ptosis, both eyelids    Venous insufficiency     Objective:  Physical Exam: Vascular: DP/PT pulses 2/4 bilateral. CFT <3 seconds. Normal hair growth on digits. No edema.  Skin. No lacerations or abrasions bilateral feet. Hyperkeratotic cored lesion noted sub fifth metatrsal base on left with disruption of skin lines. Mild hyperkeratotic lesion noted sub third metatarsal on right. Lateral second digit with hyperkeratosis as well Musculoskeletal: MMT 5/5 bilateral lower extremities in DF, PF, Inversion and Eversion. Deceased ROM in DF of ankle joint.  Neurological: Sensation intact to light touch.   Assessment:   1. Benign neoplasm of skin of foot, left       Plan:  Patient was evaluated and treated and all questions answered. -Discussed benign skin lesions with patient and treatment options.  -Hyperkeratotic tissue was debrided with chisel without incident.  -Applied salycylic acid treatment to area with dressing. Advised to remove bandaging tomorrow.  -Encouraged daily moisturizing -Discussed use of pumice  stone -Advised good supportive shoes and inserts -Patient to return to office as needed or sooner if condition worsens.   Asberry Failing, DPM
